# Patient Record
Sex: Female | Born: 1995 | Race: Black or African American | Hispanic: No | Marital: Single | Smoking: Never smoker
Health system: Southern US, Community
[De-identification: ages and names within clinical notes are randomized; demographics above are authoritative.]

## PROBLEM LIST (undated history)

## (undated) ENCOUNTER — Inpatient Hospital Stay (HOSPITAL_COMMUNITY): Payer: Self-pay

## (undated) DIAGNOSIS — I10 Essential (primary) hypertension: Secondary | ICD-10-CM

## (undated) HISTORY — DX: Essential (primary) hypertension: I10

## (undated) HISTORY — PX: NO PAST SURGERIES: SHX2092

---
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
## 2015-01-03 ENCOUNTER — Ambulatory Visit (INDEPENDENT_AMBULATORY_CARE_PROVIDER_SITE_OTHER): Payer: Medicaid Other | Admitting: *Deleted

## 2015-01-03 VITALS — BP 138/80 | HR 111

## 2015-01-03 DIAGNOSIS — O133 Gestational [pregnancy-induced] hypertension without significant proteinuria, third trimester: Secondary | ICD-10-CM

## 2015-01-06 NOTE — Progress Notes (Signed)
NST reviewed and reactive.  

## 2015-01-07 ENCOUNTER — Ambulatory Visit (INDEPENDENT_AMBULATORY_CARE_PROVIDER_SITE_OTHER): Payer: Medicaid Other | Admitting: Obstetrics and Gynecology

## 2015-01-07 VITALS — BP 145/81 | HR 102 | Wt 250.1 lb

## 2015-01-07 DIAGNOSIS — O99213 Obesity complicating pregnancy, third trimester: Secondary | ICD-10-CM | POA: Insufficient documentation

## 2015-01-07 DIAGNOSIS — O133 Gestational [pregnancy-induced] hypertension without significant proteinuria, third trimester: Secondary | ICD-10-CM

## 2015-01-07 DIAGNOSIS — E669 Obesity, unspecified: Secondary | ICD-10-CM

## 2015-01-07 LAB — POCT URINALYSIS DIP (DEVICE)
BILIRUBIN URINE: NEGATIVE
Glucose, UA: NEGATIVE mg/dL
Hgb urine dipstick: NEGATIVE
Ketones, ur: NEGATIVE mg/dL
Leukocytes, UA: NEGATIVE
Nitrite: NEGATIVE
PH: 7 (ref 5.0–8.0)
PROTEIN: NEGATIVE mg/dL
SPECIFIC GRAVITY, URINE: 1.015 (ref 1.005–1.030)
Urobilinogen, UA: 0.2 mg/dL (ref 0.0–1.0)

## 2015-01-07 NOTE — Progress Notes (Signed)
Subjective:  Erin Cardenas is a 19 y.o. G1P0000 at [redacted]w[redacted]d being seen today for ongoing prenatal care.  Patient reports no complaints. No H/A, visual sx.  Contractions: Irregular.  Vag. Bleeding: None. Movement: Present. Denies leaking of fluid.   The following portions of the patient's history were reviewed and updated as appropriate: allergies, current medications, past family history, past medical history, past social history, past surgical history and problem list.   Objective:   Filed Vitals:   01/07/15 1141 01/07/15 1207  BP: 148/72 145/81  Pulse: 102   Weight: 250 lb 1.6 oz (113.445 kg)     Fetal Status: Fetal Heart Rate (bpm): NST   Movement: Present     General:  Alert, oriented and cooperative. Patient is in no acute distress.  Skin: Skin is warm and dry. No rash noted.   Cardiovascular: Normal heart rate noted  Respiratory: Normal respiratory effort, no problems with respiration noted  Abdomen: Soft, gravid, appropriate for gestational age. Pain/Pressure: Present     Pelvic: Vag. Bleeding: None     Cervical exam deferred        Extremities: Normal range of motion.  Edema: Trace  Mental Status: Normal mood and affect. Normal behavior. Normal judgment and thought content.   Urinalysis: Urine Protein: Negative Urine Glucose: Negative  Assessment and Plan:  Pregnancy: G1P0000 at [redacted]w[redacted]d Gestational hypertension w/o significant proteinuria in 3rd trimester  Obesity affecting pregnancy in third trimester  1. Gestational hypertension w/o significant proteinuria in 3rd trimester Stable mild BP elevation.  Fetal testing, serial growth scans  Preterm labor symptoms and general obstetric precautions including but not limited to vaginal bleeding, contractions, leaking of fluid and fetal movement were reviewed in detail with the patient. Please refer to After Visit Summary for other counseling recommendations.  Return in about 1 week (around 01/14/2015) for 2x/wk as  scheduled.   Danae Orleans, CNM

## 2015-01-07 NOTE — Patient Instructions (Signed)

## 2015-01-07 NOTE — Progress Notes (Signed)
Pt denies H/A or visual disturbances.  Breastfeeding tip of the week reviewed.  

## 2015-01-10 ENCOUNTER — Ambulatory Visit (INDEPENDENT_AMBULATORY_CARE_PROVIDER_SITE_OTHER): Payer: Medicaid Other | Admitting: *Deleted

## 2015-01-10 VITALS — BP 140/61 | HR 80

## 2015-01-10 DIAGNOSIS — O133 Gestational [pregnancy-induced] hypertension without significant proteinuria, third trimester: Secondary | ICD-10-CM

## 2015-01-10 NOTE — Progress Notes (Signed)
NST reviewed and reactive.  

## 2015-01-14 ENCOUNTER — Ambulatory Visit (INDEPENDENT_AMBULATORY_CARE_PROVIDER_SITE_OTHER): Payer: Medicaid Other | Admitting: Advanced Practice Midwife

## 2015-01-14 VITALS — BP 140/86 | HR 115 | Wt 251.8 lb

## 2015-01-14 DIAGNOSIS — Z3493 Encounter for supervision of normal pregnancy, unspecified, third trimester: Secondary | ICD-10-CM | POA: Diagnosis not present

## 2015-01-14 DIAGNOSIS — O133 Gestational [pregnancy-induced] hypertension without significant proteinuria, third trimester: Secondary | ICD-10-CM

## 2015-01-14 DIAGNOSIS — Z23 Encounter for immunization: Secondary | ICD-10-CM | POA: Diagnosis not present

## 2015-01-14 LAB — POCT URINALYSIS DIP (DEVICE)
Bilirubin Urine: NEGATIVE
GLUCOSE, UA: NEGATIVE mg/dL
Ketones, ur: NEGATIVE mg/dL
Nitrite: NEGATIVE
PH: 6 (ref 5.0–8.0)
PROTEIN: NEGATIVE mg/dL
Specific Gravity, Urine: 1.02 (ref 1.005–1.030)
UROBILINOGEN UA: 0.2 mg/dL (ref 0.0–1.0)

## 2015-01-14 NOTE — Progress Notes (Signed)
Subjective:  Erin Cardenas is a 19 y.o. G1P0000 at [redacted]w[redacted]d being seen today for ongoing prenatal care.  She is here for NST for [facility] today.  Patient reports no complaints.  Contractions: Not present.  Vag. Bleeding: None. Movement: Present. Denies leaking of fluid.   The following portions of the patient's history were reviewed and updated as appropriate: allergies, current medications, past family history, past medical history, past social history, past surgical history and problem list.   Objective:   Filed Vitals:   01/14/15 1040  BP: 140/86  Pulse: 115  Weight: 251 lb 12.8 oz (114.216 kg)    Fetal Status:     Movement: Present     General:  Alert, oriented and cooperative. Patient is in no acute distress.  Skin: Skin is warm and dry. No rash noted.   Cardiovascular: Normal heart rate noted  Respiratory: Normal respiratory effort, no problems with respiration noted  Abdomen: Soft, gravid, appropriate for gestational age. Pain/Pressure: Present     Pelvic: Vag. Bleeding: None     Cervical exam deferred        Extremities: Normal range of motion.  Edema: Trace  Mental Status: Normal mood and affect. Normal behavior. Normal judgment and thought content.   Urinalysis: Urine Protein: Negative Urine Glucose: Negative  Assessment and Plan:  Pregnancy: G1P0000 at [redacted]w[redacted]d  1. Gestational hypertension w/o significant proteinuria in 3rd trimester  - Fetal nonstress test - Flu Vaccine QUAD 36+ mos IM; Standing - Flu Vaccine QUAD 36+ mos IM  Preterm labor symptoms and general obstetric precautions including but not limited to vaginal bleeding, contractions, leaking of fluid and fetal movement were reviewed in detail with the patient. Please refer to After Visit Summary for other counseling recommendations.  F/U as scheduled for 2x/week testing.     Hurshel Party, CNM

## 2015-01-16 LAB — OB RESULTS CONSOLE GBS: STREP GROUP B AG: POSITIVE

## 2015-01-17 ENCOUNTER — Ambulatory Visit (HOSPITAL_COMMUNITY)
Admission: RE | Admit: 2015-01-17 | Discharge: 2015-01-17 | Disposition: A | Discharge status: Home / Self Care | Payer: Medicaid Other | Source: Ambulatory Visit | Attending: Advanced Practice Midwife | Admitting: Advanced Practice Midwife

## 2015-01-17 ENCOUNTER — Other Ambulatory Visit: Payer: Self-pay | Admitting: Certified Nurse Midwife

## 2015-01-17 ENCOUNTER — Ambulatory Visit (INDEPENDENT_AMBULATORY_CARE_PROVIDER_SITE_OTHER): Payer: Medicaid Other | Admitting: *Deleted

## 2015-01-17 VITALS — BP 135/86 | HR 104 | Wt 252.7 lb

## 2015-01-17 DIAGNOSIS — O139 Gestational [pregnancy-induced] hypertension without significant proteinuria, unspecified trimester: Secondary | ICD-10-CM | POA: Diagnosis not present

## 2015-01-17 DIAGNOSIS — O133 Gestational [pregnancy-induced] hypertension without significant proteinuria, third trimester: Secondary | ICD-10-CM

## 2015-01-17 DIAGNOSIS — Z3A00 Weeks of gestation of pregnancy not specified: Secondary | ICD-10-CM | POA: Insufficient documentation

## 2015-01-17 DIAGNOSIS — IMO0002 Reserved for concepts with insufficient information to code with codable children: Secondary | ICD-10-CM

## 2015-01-17 NOTE — Progress Notes (Signed)
NST today

## 2015-01-19 NOTE — Progress Notes (Signed)
01/17/2015 NST reviewed and reactive 

## 2015-01-21 ENCOUNTER — Other Ambulatory Visit (HOSPITAL_COMMUNITY)
Admission: RE | Admit: 2015-01-21 | Discharge: 2015-01-21 | Disposition: A | Discharge status: Home / Self Care | Payer: Medicaid Other | Source: Ambulatory Visit | Attending: Advanced Practice Midwife | Admitting: Advanced Practice Midwife

## 2015-01-21 ENCOUNTER — Ambulatory Visit (INDEPENDENT_AMBULATORY_CARE_PROVIDER_SITE_OTHER): Payer: Medicaid Other | Admitting: Advanced Practice Midwife

## 2015-01-21 VITALS — BP 154/71 | HR 108 | Wt 253.5 lb

## 2015-01-21 DIAGNOSIS — Z113 Encounter for screening for infections with a predominantly sexual mode of transmission: Secondary | ICD-10-CM | POA: Insufficient documentation

## 2015-01-21 DIAGNOSIS — O133 Gestational [pregnancy-induced] hypertension without significant proteinuria, third trimester: Secondary | ICD-10-CM

## 2015-01-21 LAB — POCT URINALYSIS DIP (DEVICE)
BILIRUBIN URINE: NEGATIVE
Glucose, UA: 500 mg/dL — AB
HGB URINE DIPSTICK: NEGATIVE
Ketones, ur: NEGATIVE mg/dL
LEUKOCYTES UA: NEGATIVE
NITRITE: NEGATIVE
Protein, ur: NEGATIVE mg/dL
SPECIFIC GRAVITY, URINE: 1.025 (ref 1.005–1.030)
Urobilinogen, UA: 0.2 mg/dL (ref 0.0–1.0)
pH: 6 (ref 5.0–8.0)

## 2015-01-21 LAB — CBC
HCT: 33.6 % — ABNORMAL LOW (ref 36.0–46.0)
HEMOGLOBIN: 11.1 g/dL — AB (ref 12.0–15.0)
MCH: 28.9 pg (ref 26.0–34.0)
MCHC: 33 g/dL (ref 30.0–36.0)
MCV: 87.5 fL (ref 78.0–100.0)
MPV: 10.3 fL (ref 8.6–12.4)
Platelets: 320 10*3/uL (ref 150–400)
RBC: 3.84 MIL/uL — AB (ref 3.87–5.11)
RDW: 14.8 % (ref 11.5–15.5)
WBC: 10 10*3/uL (ref 4.0–10.5)

## 2015-01-21 LAB — COMPREHENSIVE METABOLIC PANEL
ALBUMIN: 3.4 g/dL — AB (ref 3.6–5.1)
ALK PHOS: 166 U/L (ref 47–176)
ALT: 12 U/L (ref 5–32)
AST: 14 U/L (ref 12–32)
BILIRUBIN TOTAL: 0.4 mg/dL (ref 0.2–1.1)
BUN: 7 mg/dL (ref 7–20)
CHLORIDE: 103 mmol/L (ref 98–110)
CO2: 23 mmol/L (ref 20–31)
CREATININE: 0.57 mg/dL (ref 0.50–1.00)
Calcium: 9.6 mg/dL (ref 8.9–10.4)
Glucose, Bld: 77 mg/dL (ref 65–99)
Potassium: 4.6 mmol/L (ref 3.8–5.1)
SODIUM: 136 mmol/L (ref 135–146)
TOTAL PROTEIN: 6.7 g/dL (ref 6.3–8.2)

## 2015-01-21 LAB — OB RESULTS CONSOLE GC/CHLAMYDIA: Gonorrhea: NEGATIVE

## 2015-01-21 NOTE — Progress Notes (Signed)
Pt denies H/A or visual disturbances.  [place] for growth scheduled 9/2.

## 2015-01-21 NOTE — Progress Notes (Signed)
Subjective:  Erin Cardenas is a 19 y.o. G1P0000 at [redacted]w[redacted]d being seen today for ongoing prenatal care.  Patient reports no complaints.  Contractions: Not present.  Vag. Bleeding: None. Movement: Present. Denies leaking of fluid.   The following portions of the patient's history were reviewed and updated as appropriate: allergies, current medications, past family history, past medical history, past social history, past surgical history and problem list.   Objective:   Filed Vitals:   01/21/15 1118 01/21/15 1149  BP: 150/79 154/71  Pulse: 108   Weight: 253 lb 8 oz (114.987 kg)     Fetal Status: Fetal Heart Rate (bpm): NST   Movement: Present     General:  Alert, oriented and cooperative. Patient is in no acute distress.  Skin: Skin is warm and dry. No rash noted.   Cardiovascular: Normal heart rate noted  Respiratory: Normal respiratory effort, no problems with respiration noted  Abdomen: Soft, gravid, appropriate for gestational age. Pain/Pressure: Present     Pelvic: Vag. Bleeding: None     Cervical exam deferred        Extremities: Normal range of motion.  Edema: Trace  Mental Status: Normal mood and affect. Normal behavior. Normal judgment and thought content.   Urinalysis: Urine Protein: Negative Urine Glucose: 3+  Assessment and Plan:  Pregnancy: G1P0000 at [redacted]w[redacted]d  1. Gestational hypertension w/o significant proteinuria in 3rd trimester  - Fetal nonstress test - Culture, beta strep (group b only) - GC/Chlamydia probe amp ([place])not at [facility] labs on 8/30  r/t elevated BP: - CBC - Comp Met (CMET) - Protein / creatinine ratio, urine  Preterm labor symptoms and general obstetric precautions including but not limited to vaginal bleeding, contractions, leaking of fluid and fetal movement were reviewed in detail with the patient.    Consult Dr Harolyn Rutherford r/t elevated BP today.  Reviewed assessment and BPs.  Labs drawn in clinic today.  Preeclampsia precautions given.   Pt has [place] at MFM on Friday and will have BP check at that time.  Please refer to After Visit Summary for other counseling recommendations.  Return in about 3 days (around 01/24/2015) for 2x/wk as scheduled.   Elvera Maria, CNM

## 2015-01-22 LAB — GC/CHLAMYDIA PROBE AMP (~~LOC~~) NOT AT ARMC
Chlamydia: NEGATIVE
Neisseria Gonorrhea: NEGATIVE

## 2015-01-22 LAB — PROTEIN / CREATININE RATIO, URINE
Creatinine, Urine: 131.8 mg/dL
PROTEIN CREATININE RATIO: 0.16 — AB (ref <0.15)
TOTAL PROTEIN, URINE: 21 mg/dL (ref 5–24)

## 2015-01-22 LAB — CULTURE, BETA STREP (GROUP B ONLY)

## 2015-01-24 ENCOUNTER — Other Ambulatory Visit: Payer: Self-pay

## 2015-01-24 ENCOUNTER — Ambulatory Visit (HOSPITAL_COMMUNITY): Payer: Medicaid Other

## 2015-01-24 ENCOUNTER — Encounter: Payer: Self-pay | Admitting: Advanced Practice Midwife

## 2015-01-24 DIAGNOSIS — O99820 Streptococcus B carrier state complicating pregnancy: Secondary | ICD-10-CM | POA: Insufficient documentation

## 2015-01-29 ENCOUNTER — Encounter (HOSPITAL_COMMUNITY): Payer: Self-pay | Admitting: *Deleted

## 2015-01-29 ENCOUNTER — Telehealth (HOSPITAL_COMMUNITY): Payer: Self-pay | Admitting: *Deleted

## 2015-01-29 ENCOUNTER — Ambulatory Visit (HOSPITAL_COMMUNITY): Payer: Medicaid Other

## 2015-01-29 ENCOUNTER — Ambulatory Visit (INDEPENDENT_AMBULATORY_CARE_PROVIDER_SITE_OTHER): Payer: Medicaid Other | Admitting: Obstetrics and Gynecology

## 2015-01-29 VITALS — BP 149/78 | HR 102 | Wt 255.2 lb

## 2015-01-29 DIAGNOSIS — O133 Gestational [pregnancy-induced] hypertension without significant proteinuria, third trimester: Secondary | ICD-10-CM | POA: Diagnosis not present

## 2015-01-29 LAB — POCT URINALYSIS DIP (DEVICE)
BILIRUBIN URINE: NEGATIVE
GLUCOSE, UA: NEGATIVE mg/dL
Hgb urine dipstick: NEGATIVE
KETONES UR: NEGATIVE mg/dL
LEUKOCYTES UA: NEGATIVE
Nitrite: NEGATIVE
PROTEIN: NEGATIVE mg/dL
SPECIFIC GRAVITY, URINE: 1.02 (ref 1.005–1.030)
Urobilinogen, UA: 0.2 mg/dL (ref 0.0–1.0)
pH: 6.5 (ref 5.0–8.0)

## 2015-01-29 NOTE — Progress Notes (Signed)
Subjective:  Erin Cardenas is a 19 y.o. G1P0000 at [redacted]w[redacted]d being seen today for ongoing prenatal care.  Patient reports no complaints.  Contractions: Not present.  Vag. Bleeding: None. Movement: Present. Denies leaking of fluid.   The following portions of the patient's history were reviewed and updated as appropriate: allergies, current medications, past family history, past medical history, past social history, past surgical history and problem list.   Objective:   Filed Vitals:   01/29/15 0929 01/29/15 0955  BP: 143/74 149/78  Pulse: 102   Weight: 255 lb 3.2 oz (115.758 kg)     Fetal Status: Fetal Heart Rate (bpm): NST Fundal Height: 38 cm Movement: Present  Presentation: Vertex  General:  Alert, oriented and cooperative. Patient is in no acute distress.  Skin: Skin is warm and dry. No rash noted.   Cardiovascular: Normal heart rate noted  Respiratory: Normal respiratory effort, no problems with respiration noted  Abdomen: Soft, gravid, appropriate for gestational age. Pain/Pressure: Present     Pelvic: Vag. Bleeding: None     Cervical exam deferred        Extremities: Normal range of motion.  Edema: Trace  Mental Status: Normal mood and affect. Normal behavior. Normal judgment and thought content.   Urinalysis: Urine Protein: Negative Urine Glucose: Negative  Assessment and Plan:  Pregnancy: G1P0000 at [redacted]w[redacted]d  1. Gestational hypertension w/o significant proteinuria in 3rd trimester - no severe elevations today, prec labs last visit unremarkable, no symptoms preeclampsia today - scheduling induction at 37 weeks - Fetal nonstress test reactive - mfm u/s today, will f/u results  Preterm labor symptoms and general obstetric precautions including but not limited to vaginal bleeding, contractions, leaking of fluid and fetal movement were reviewed in detail with the patient. Please refer to After Visit Summary for other counseling recommendations.   Return in about 2 days (around  01/31/2015) for NST as scheduled.   Kathrynn Running, MD

## 2015-01-29 NOTE — Telephone Encounter (Signed)
Preadmission screen  

## 2015-01-29 NOTE — Progress Notes (Deleted)
Pt denies H/A or visual disturbances.  

## 2015-01-29 NOTE — Progress Notes (Signed)
Pt denies H/A or visual disturbances. She missed [place] for growth on 9/2 due to no transportation- rescheduled to today @ 1500 provided pt will have transportation.

## 2015-01-31 ENCOUNTER — Ambulatory Visit (INDEPENDENT_AMBULATORY_CARE_PROVIDER_SITE_OTHER): Payer: Medicaid Other | Admitting: *Deleted

## 2015-01-31 VITALS — BP 153/79 | HR 102

## 2015-01-31 DIAGNOSIS — O133 Gestational [pregnancy-induced] hypertension without significant proteinuria, third trimester: Secondary | ICD-10-CM

## 2015-01-31 NOTE — Progress Notes (Signed)
NST performed today was reviewed and was found to be reactive.  Continue recommended antenatal testing and prenatal care.  

## 2015-01-31 NOTE — Progress Notes (Signed)
Pt denies H/A or visual disturbances.  She was not able to keep appt for [place] on 9/7.  Consult w/ Dr. Macon Large - pt does not need [place] today.  Return as scheduled for IOL.

## 2015-02-02 ENCOUNTER — Encounter (HOSPITAL_COMMUNITY): Payer: Self-pay

## 2015-02-02 ENCOUNTER — Inpatient Hospital Stay (HOSPITAL_COMMUNITY)
Admission: RE | Admit: 2015-02-02 | Discharge: 2015-02-05 | DRG: 775 | Disposition: A | Discharge status: Home / Self Care | Payer: Medicaid Other | Source: Ambulatory Visit | Attending: Family Medicine | Admitting: Family Medicine

## 2015-02-02 VITALS — BP 138/81 | HR 108 | Temp 98.3°F | Resp 20 | Ht 64.0 in | Wt 255.0 lb

## 2015-02-02 DIAGNOSIS — Z6841 Body Mass Index (BMI) 40.0 and over, adult: Secondary | ICD-10-CM | POA: Diagnosis not present

## 2015-02-02 DIAGNOSIS — O99214 Obesity complicating childbirth: Secondary | ICD-10-CM | POA: Diagnosis present

## 2015-02-02 DIAGNOSIS — O99213 Obesity complicating pregnancy, third trimester: Secondary | ICD-10-CM | POA: Diagnosis present

## 2015-02-02 DIAGNOSIS — O133 Gestational [pregnancy-induced] hypertension without significant proteinuria, third trimester: Secondary | ICD-10-CM | POA: Diagnosis present

## 2015-02-02 DIAGNOSIS — Z3A37 37 weeks gestation of pregnancy: Secondary | ICD-10-CM | POA: Diagnosis present

## 2015-02-02 DIAGNOSIS — O99820 Streptococcus B carrier state complicating pregnancy: Secondary | ICD-10-CM

## 2015-02-02 DIAGNOSIS — Z809 Family history of malignant neoplasm, unspecified: Secondary | ICD-10-CM | POA: Diagnosis not present

## 2015-02-02 DIAGNOSIS — Z30018 Encounter for initial prescription of other contraceptives: Secondary | ICD-10-CM | POA: Diagnosis not present

## 2015-02-02 DIAGNOSIS — Z8249 Family history of ischemic heart disease and other diseases of the circulatory system: Secondary | ICD-10-CM | POA: Diagnosis not present

## 2015-02-02 DIAGNOSIS — O99824 Streptococcus B carrier state complicating childbirth: Secondary | ICD-10-CM | POA: Diagnosis present

## 2015-02-02 DIAGNOSIS — IMO0002 Reserved for concepts with insufficient information to code with codable children: Secondary | ICD-10-CM | POA: Diagnosis present

## 2015-02-02 DIAGNOSIS — Z3490 Encounter for supervision of normal pregnancy, unspecified, unspecified trimester: Secondary | ICD-10-CM

## 2015-02-02 DIAGNOSIS — Z3493 Encounter for supervision of normal pregnancy, unspecified, third trimester: Secondary | ICD-10-CM

## 2015-02-02 LAB — RAPID HIV SCREEN (HIV 1/2 AB+AG)
HIV 1/2 Antibodies: NONREACTIVE
HIV-1 P24 ANTIGEN - HIV24: NONREACTIVE

## 2015-02-02 LAB — PROTEIN / CREATININE RATIO, URINE
Creatinine, Urine: 181 mg/dL
PROTEIN CREATININE RATIO: 0.07 mg/mg{creat} (ref 0.00–0.15)
TOTAL PROTEIN, URINE: 12 mg/dL

## 2015-02-02 LAB — COMPREHENSIVE METABOLIC PANEL
ALK PHOS: 172 U/L — AB (ref 38–126)
ALT: 13 U/L — AB (ref 14–54)
ANION GAP: 8 (ref 5–15)
AST: 14 U/L — ABNORMAL LOW (ref 15–41)
Albumin: 3.2 g/dL — ABNORMAL LOW (ref 3.5–5.0)
BUN: 10 mg/dL (ref 6–20)
CALCIUM: 9 mg/dL (ref 8.9–10.3)
CO2: 22 mmol/L (ref 22–32)
CREATININE: 0.58 mg/dL (ref 0.44–1.00)
Chloride: 104 mmol/L (ref 101–111)
Glucose, Bld: 87 mg/dL (ref 65–99)
Potassium: 3.8 mmol/L (ref 3.5–5.1)
SODIUM: 134 mmol/L — AB (ref 135–145)
Total Bilirubin: 0.3 mg/dL (ref 0.3–1.2)
Total Protein: 6.9 g/dL (ref 6.5–8.1)

## 2015-02-02 LAB — ABO/RH: ABO/RH(D): O POS

## 2015-02-02 LAB — CBC
HEMATOCRIT: 31.6 % — AB (ref 36.0–46.0)
Hemoglobin: 10.4 g/dL — ABNORMAL LOW (ref 12.0–15.0)
MCH: 28.9 pg (ref 26.0–34.0)
MCHC: 32.9 g/dL (ref 30.0–36.0)
MCV: 87.8 fL (ref 78.0–100.0)
PLATELETS: 273 10*3/uL (ref 150–400)
RBC: 3.6 MIL/uL — ABNORMAL LOW (ref 3.87–5.11)
RDW: 15.1 % (ref 11.5–15.5)
WBC: 10.4 10*3/uL (ref 4.0–10.5)

## 2015-02-02 LAB — TYPE AND SCREEN
ABO/RH(D): O POS
Antibody Screen: NEGATIVE

## 2015-02-02 LAB — RPR: RPR: NONREACTIVE

## 2015-02-02 MED ORDER — OXYCODONE-ACETAMINOPHEN 5-325 MG PO TABS: 1.0000 | ORAL_TABLET | ORAL | Status: DC | PRN

## 2015-02-02 MED ORDER — OXYTOCIN 40 UNITS IN LACTATED RINGERS INFUSION - SIMPLE MED: 1.0000 m[IU]/min | INTRAVENOUS | Status: DC

## 2015-02-02 MED ORDER — LACTATED RINGERS IV SOLN
INTRAVENOUS | Status: DC
  Administered 2015-02-02: 02:00:00 via INTRAVENOUS
  Administered 2015-02-03: 1000 mL via INTRAVENOUS
  Administered 2015-02-03: 09:00:00 via INTRAVENOUS

## 2015-02-02 MED ORDER — LACTATED RINGERS IV SOLN
500.0000 mL | INTRAVENOUS | Status: DC | PRN
  Administered 2015-02-03: 1000 mL via INTRAVENOUS

## 2015-02-02 MED ORDER — OXYTOCIN BOLUS FROM INFUSION
500.0000 mL | INTRAVENOUS | Status: DC
  Administered 2015-02-03: 500 mL via INTRAVENOUS

## 2015-02-02 MED ORDER — PENICILLIN G POTASSIUM 5000000 UNITS IJ SOLR
2.5000 10*6.[IU] | INTRAMUSCULAR | Status: DC
  Administered 2015-02-03 (×3): 2.5 10*6.[IU] via INTRAVENOUS
  Filled 2015-02-02 (×12): qty 2.5

## 2015-02-02 MED ORDER — OXYTOCIN 40 UNITS IN LACTATED RINGERS INFUSION - SIMPLE MED
62.5000 mL/h | INTRAVENOUS | Status: DC
  Administered 2015-02-03: 62.5 mL/h via INTRAVENOUS
  Filled 2015-02-02: qty 1000

## 2015-02-02 MED ORDER — OXYCODONE-ACETAMINOPHEN 5-325 MG PO TABS: 2.0000 | ORAL_TABLET | ORAL | Status: DC | PRN

## 2015-02-02 MED ORDER — MISOPROSTOL 50MCG HALF TABLET
50.0000 ug | ORAL_TABLET | ORAL | Status: DC | PRN
  Administered 2015-02-02 – 2015-02-03 (×6): 50 ug via ORAL
  Filled 2015-02-02 (×7): qty 0.5

## 2015-02-02 MED ORDER — NALBUPHINE HCL 10 MG/ML IJ SOLN
10.0000 mg | INTRAMUSCULAR | Status: DC | PRN
  Administered 2015-02-03 (×2): 10 mg via INTRAVENOUS
  Filled 2015-02-02 (×3): qty 1

## 2015-02-02 MED ORDER — CITRIC ACID-SODIUM CITRATE 334-500 MG/5ML PO SOLN: 30.0000 mL | ORAL | Status: DC | PRN

## 2015-02-02 MED ORDER — PENICILLIN G POTASSIUM 5000000 UNITS IJ SOLR
5.0000 10*6.[IU] | Freq: Once | INTRAVENOUS | Status: DC
  Filled 2015-02-02: qty 5

## 2015-02-02 MED ORDER — TERBUTALINE SULFATE 1 MG/ML IJ SOLN: 0.2500 mg | Freq: Once | INTRAMUSCULAR | Status: DC | PRN

## 2015-02-02 MED ORDER — ONDANSETRON HCL 4 MG/2ML IJ SOLN: 4.0000 mg | Freq: Four times a day (QID) | INTRAMUSCULAR | Status: DC | PRN

## 2015-02-02 MED ORDER — ACETAMINOPHEN 325 MG PO TABS: 650.0000 mg | ORAL_TABLET | ORAL | Status: DC | PRN

## 2015-02-02 MED ORDER — LIDOCAINE HCL (PF) 1 % IJ SOLN
30.0000 mL | INTRAMUSCULAR | Status: DC | PRN
  Filled 2015-02-02: qty 30

## 2015-02-02 MED ORDER — PROMETHAZINE HCL 25 MG/ML IJ SOLN
12.5000 mg | INTRAMUSCULAR | Status: DC | PRN
  Administered 2015-02-03 (×2): 12.5 mg via INTRAVENOUS
  Filled 2015-02-02 (×2): qty 1

## 2015-02-02 NOTE — Progress Notes (Signed)
Patient ID: Erin Cardenas, female   DOB: [birth year], 19 y.o.   MRN: [id number] S: c/o mild contractions O: VSS, FHR pattern reassurring, mild uc's, SVE cl/th/post/high A: GHTN at 37 wks IOL P: continue cytotec

## 2015-02-02 NOTE — Progress Notes (Signed)
Erin Cardenas is a 19 y.o. G1P0000 at [redacted]w[redacted]d by ultrasound admitted for induction of labor due to Hypertension.  Subjective:   Objective: BP 137/77 mmHg  Pulse 113  Temp(Src) 98.4 F (36.9 C) (Oral)  Resp 18  Ht  (1.626 m)  Wt 255 lb (115.667 kg)  BMI 43.75 kg/m2  LMP 05/19/2014      FHT:  FHR: 140 bpm, variability: moderate,  accelerations:  Present,  decelerations:  Absent UC:   occasional SVE:   Dilation: Closed (internal os closed--external 0.5) Effacement (%): 30 Station: Ballotable Exam by:: J.Thornton, RN   Labs: Lab Results  Component Value Date   WBC 10.4 02/02/2015   HGB 10.4* 02/02/2015   HCT 31.6* 02/02/2015   MCV 87.8 02/02/2015   PLT 273 02/02/2015    Assessment / Plan: Induction of labor due to gestational hypertension,  progressing well on pitocin  Labor: not in labor cervical ripening in process Preeclampsia:  no signs or symptoms of toxicity, intake and ouput balanced and labs stable Fetal Wellbeing:  Category I Pain Control:  Labor support without medications I/D:  n/a Anticipated MOD:  NSVD  Erin Cardenas 02/02/2015, 1:26 PM

## 2015-02-02 NOTE — H&P (Signed)
OBSTETRIC ADMISSION HISTORY AND PHYSICAL  Erin Cardenas is a 19 y.o. female G1P0000 with IUP at [redacted]w[redacted]d by L/10 presenting for IOL due to gestational hypertension. She reports +FMs, No LOF, no VB, no blurry vision, headaches or peripheral edema, and RUQ pain.  She plans on breast feeding. She request Nexplanon for birth control.  Dating: By L/10 --->  Estimated Date of Delivery: 02/23/15  Prenatal History/Complications:  Clinic  [facility] Prenatal Labs  Dating  LMP c/w 10w sono Blood type: O/POS/-- (03/18 1113) O+  Genetic Screen 1 Screen:  NT normal   AFP: normal Antibody:NEG (03/18 1113)neg  Anatomic [place]  [redacted]w[redacted]d normal sono Rubella: 2.39 (03/18 1113)Immune  GTT Early:               Third trimester: 105 RPR: NON REAC (03/18 1113) NR  Flu vaccine  01/14/15 HBsAg: NEGATIVE (03/18 1113) neg  TDaP vaccine  12/09/14                                           HIV: NONREACTIVE (03/18 1113) NR  GBS   POS                                      GBS:   Contraception  considering IP nexpl Pap:  Baby Food  breast   Circumcision    Pediatrician    Support Person      Past Medical History: Past Medical History  Diagnosis Date  . Hypertension    Past Surgical History: Past Surgical History  Procedure Laterality Date  . No past surgeries      Obstetrical History: OB History    Gravida Para Term Preterm AB TAB SAB Ectopic Multiple Living   1 0 0 0 0 0 0 0 0 0       Social History: Social History   Social History  . Marital Status: Single    Spouse Name: N/A  . Number of Children: N/A  . Years of Education: N/A   Social History Main Topics  . Smoking status: Never Smoker   . Smokeless tobacco: Never Used  . Alcohol Use: No  . Drug Use: No  . Sexual Activity: Yes   Other Topics Concern  . None   Social History Narrative    Family History: Family History  Problem Relation Age of Onset  . Hypertension Mother   . Cancer Maternal Grandmother     Allergies: No Known  Allergies  Prescriptions prior to admission  Medication Sig Dispense Refill Last Dose  . Prenatal Vit-Fe Fumarate-FA (PRENATAL 19) 29-1 MG CHEW Chew 1 tablet by mouth daily. 30 tablet 12 Taking     Review of Systems  All systems reviewed and negative except as stated in HPI  Blood pressure 130/75, pulse 108, temperature 98.8 F (37.1 C), temperature source Oral, height 5\' 4"  (1.626 m), weight 255 lb (115.667 kg), last menstrual period 05/19/2014. General appearance: alert, cooperative and appears stated age Lungs: clear to auscultation bilaterally Heart: regular rate and rhythm Abdomen: soft, non-tender; bowel sounds normal Pelvic: adequate Extremities: Homans sign is negative, no sign of DVT. 1+ pitting edema.  DTR's 2+ in patella. No clonus Presentation: cephalic Fetal monitoringBaseline: 135 bpm, Variability: Good {> 6 bpm) and Accelerations: Reactive Uterine activityNone     Prenatal  labs: ABO, Rh: O/POS/-- (03/18 1113) Antibody: NEG (03/18 1113) Rubella:   RPR: NON REAC (07/18 1710)  HBsAg: NEGATIVE (03/18 1113)  HIV: NONREACTIVE (07/18 1710)  GBS: Positive (08/25 0000)  1 hr Glucola 105 Genetic screening  NT wnl Anatomy US wnl  Prenatal Transfer Tool  Maternal Diabetes: No Genetic Screening: Normal Maternal Ultrasounds/Referrals: Normal Fetal Ultrasounds or other Referrals:  None Maternal Substance Abuse:  No Significant Maternal Medications:  None Significant Maternal Lab Results: Lab values include: Group B Strep positive  No results found for this or any previous visit (from the past 24 hour(s)).  Patient Active Problem List   Diagnosis Date Noted  . Term pregnancy 02/02/2015  . GBS (group B Streptococcus carrier), +RV culture, currently pregnant 01/24/2015  . Obesity affecting pregnancy in third trimester 01/07/2015  . Gestational hypertension w/o significant proteinuria in 3rd trimester 12/23/2014  . Supervision of normal pregnancy 08/09/2014  . Teen  pregnancy 08/09/2014    Assessment: Erin Cardenas is a 19 y.o. G1P0000 at [redacted]w[redacted]d here for IOL for gestational hypertension  #Labor:Induction with cervical ripening. Plan for cytotec and FB when appropriate. Pitocin to start when favorable (bishop>8) #Pain: IV meds and epidural in active labor #FWB: Cat I #ID:  GBS pos- PCN #MOF: breast/bottle #MOC:Nexplanon, likely inpatient #Circ:  Desired inpatient, baby will be on FOB insurance  #gHTN: BP is within normal limits.  -will get CMP and UPC for baseline -monitor BP -treat BP>160/110, if not responsive consider magnesium  Erin Cardenas 02/02/2015, 2:00 AM

## 2015-02-03 ENCOUNTER — Encounter (HOSPITAL_COMMUNITY): Payer: Self-pay

## 2015-02-03 ENCOUNTER — Inpatient Hospital Stay (HOSPITAL_COMMUNITY): Payer: Medicaid Other | Admitting: Anesthesiology

## 2015-02-03 DIAGNOSIS — O133 Gestational [pregnancy-induced] hypertension without significant proteinuria, third trimester: Secondary | ICD-10-CM

## 2015-02-03 DIAGNOSIS — O99824 Streptococcus B carrier state complicating childbirth: Secondary | ICD-10-CM

## 2015-02-03 DIAGNOSIS — Z3A37 37 weeks gestation of pregnancy: Secondary | ICD-10-CM

## 2015-02-03 LAB — CBC
HEMATOCRIT: 32.2 % — AB (ref 36.0–46.0)
HEMOGLOBIN: 10.7 g/dL — AB (ref 12.0–15.0)
MCH: 28.8 pg (ref 26.0–34.0)
MCHC: 33.2 g/dL (ref 30.0–36.0)
MCV: 86.8 fL (ref 78.0–100.0)
Platelets: 274 10*3/uL (ref 150–400)
RBC: 3.71 MIL/uL — AB (ref 3.87–5.11)
RDW: 15.1 % (ref 11.5–15.5)
WBC: 14.4 10*3/uL — AB (ref 4.0–10.5)

## 2015-02-03 MED ORDER — ONDANSETRON HCL 4 MG/2ML IJ SOLN: 4.0000 mg | INTRAMUSCULAR | Status: DC | PRN

## 2015-02-03 MED ORDER — SIMETHICONE 80 MG PO CHEW: 80.0000 mg | CHEWABLE_TABLET | ORAL | Status: DC | PRN

## 2015-02-03 MED ORDER — OXYCODONE-ACETAMINOPHEN 5-325 MG PO TABS: 2.0000 | ORAL_TABLET | ORAL | Status: DC | PRN

## 2015-02-03 MED ORDER — ACETAMINOPHEN 325 MG PO TABS: 650.0000 mg | ORAL_TABLET | ORAL | Status: DC | PRN

## 2015-02-03 MED ORDER — EPHEDRINE 5 MG/ML INJ: 10.0000 mg | INTRAVENOUS | Status: DC | PRN

## 2015-02-03 MED ORDER — FENTANYL 2.5 MCG/ML BUPIVACAINE 1/10 % EPIDURAL INFUSION (WH - ANES)
14.0000 mL/h | INTRAMUSCULAR | Status: DC | PRN
  Administered 2015-02-03: 14 mL/h via EPIDURAL
  Filled 2015-02-03: qty 125

## 2015-02-03 MED ORDER — DIPHENHYDRAMINE HCL 50 MG/ML IJ SOLN: 12.5000 mg | INTRAMUSCULAR | Status: DC | PRN

## 2015-02-03 MED ORDER — ZOLPIDEM TARTRATE 5 MG PO TABS: 5.0000 mg | ORAL_TABLET | Freq: Every evening | ORAL | Status: DC | PRN

## 2015-02-03 MED ORDER — WITCH HAZEL-GLYCERIN EX PADS: 1.0000 "application " | MEDICATED_PAD | CUTANEOUS | Status: DC | PRN

## 2015-02-03 MED ORDER — LANOLIN HYDROUS EX OINT: TOPICAL_OINTMENT | CUTANEOUS | Status: DC | PRN

## 2015-02-03 MED ORDER — PRENATAL MULTIVITAMIN CH
1.0000 | ORAL_TABLET | Freq: Every day | ORAL | Status: DC
  Administered 2015-02-04 – 2015-02-05 (×2): 1 via ORAL
  Filled 2015-02-03 (×2): qty 1

## 2015-02-03 MED ORDER — TETANUS-DIPHTH-ACELL PERTUSSIS 5-2.5-18.5 LF-MCG/0.5 IM SUSP
0.5000 mL | Freq: Once | INTRAMUSCULAR | Status: DC
  Filled 2015-02-03: qty 0.5

## 2015-02-03 MED ORDER — PHENYLEPHRINE 40 MCG/ML (10ML) SYRINGE FOR IV PUSH (FOR BLOOD PRESSURE SUPPORT)
PREFILLED_SYRINGE | INTRAVENOUS | Status: AC
  Filled 2015-02-03: qty 20

## 2015-02-03 MED ORDER — IBUPROFEN 600 MG PO TABS
600.0000 mg | ORAL_TABLET | Freq: Four times a day (QID) | ORAL | Status: DC
  Administered 2015-02-03 – 2015-02-05 (×8): 600 mg via ORAL
  Filled 2015-02-03 (×8): qty 1

## 2015-02-03 MED ORDER — PHENYLEPHRINE 40 MCG/ML (10ML) SYRINGE FOR IV PUSH (FOR BLOOD PRESSURE SUPPORT): 80.0000 ug | PREFILLED_SYRINGE | INTRAVENOUS | Status: DC | PRN

## 2015-02-03 MED ORDER — FENTANYL CITRATE (PF) 100 MCG/2ML IJ SOLN
100.0000 ug | INTRAMUSCULAR | Status: DC | PRN
  Administered 2015-02-03: 100 ug via INTRAVENOUS
  Filled 2015-02-03: qty 2

## 2015-02-03 MED ORDER — BENZOCAINE-MENTHOL 20-0.5 % EX AERO
1.0000 "application " | INHALATION_SPRAY | CUTANEOUS | Status: DC | PRN
  Administered 2015-02-05: 1 via TOPICAL
  Filled 2015-02-03 (×2): qty 56

## 2015-02-03 MED ORDER — ONDANSETRON HCL 4 MG PO TABS: 4.0000 mg | ORAL_TABLET | ORAL | Status: DC | PRN

## 2015-02-03 MED ORDER — DIPHENHYDRAMINE HCL 25 MG PO CAPS: 25.0000 mg | ORAL_CAPSULE | Freq: Four times a day (QID) | ORAL | Status: DC | PRN

## 2015-02-03 MED ORDER — OXYCODONE-ACETAMINOPHEN 5-325 MG PO TABS: 1.0000 | ORAL_TABLET | ORAL | Status: DC | PRN

## 2015-02-03 MED ORDER — DIBUCAINE 1 % RE OINT
1.0000 "application " | TOPICAL_OINTMENT | RECTAL | Status: DC | PRN
  Filled 2015-02-03: qty 28

## 2015-02-03 MED ORDER — LIDOCAINE HCL (PF) 1 % IJ SOLN
INTRAMUSCULAR | Status: DC | PRN
  Administered 2015-02-03 (×2): 4 mL

## 2015-02-03 MED ORDER — SENNOSIDES-DOCUSATE SODIUM 8.6-50 MG PO TABS
2.0000 | ORAL_TABLET | ORAL | Status: DC
  Administered 2015-02-03 – 2015-02-04 (×2): 2 via ORAL
  Filled 2015-02-03 (×3): qty 2

## 2015-02-03 NOTE — Progress Notes (Signed)
Erin Cardenas is a 19 y.o. G1P0000 at 108w1d by ultrasound admitted for induction of labor due to Hypertension.  Subjective:   Objective: BP 96/67 mmHg  Pulse 93  Temp(Src) 97.8 F (36.6 C) (Oral)  Resp 18  Ht  (1.626 m)  Wt 255 lb (115.667 kg)  BMI 43.75 kg/m2  LMP 05/19/2014      FHT:  FHR: 135 bpm, variability: moderate,  accelerations:  Present,  decelerations:  Absent UC:   regular, every 2-3 minutes SVE:   Dilation: 1 Effacement (%): 50 Station: -2 Exam by:: Erin Europe RN  Labs: Lab Results  Component Value Date   WBC 10.4 02/02/2015   HGB 10.4* 02/02/2015   HCT 31.6* 02/02/2015   MCV 87.8 02/02/2015   PLT 273 02/02/2015    Assessment / Plan: Induction of labor due to gestational hypertension,  progressing well on pitocin  Labor: Progressing normally Preeclampsia:  no signs or symptoms of toxicity and intake and ouput balanced Fetal Wellbeing:  Category I Pain Control:  fentanyl I/D:  n/a Anticipated MOD:  NSVD  Erin Cardenas 02/03/2015, 6:26 AM

## 2015-02-03 NOTE — Progress Notes (Signed)
Erin Cardenas is a 19 y.o. G1P0000 at [redacted]w[redacted]d by ultrasound admitted for induction of labor due to Hypertension.  Subjective:   Objective: BP 96/67 mmHg  Pulse 93  Temp(Src) 97.8 F (36.6 C) (Oral)  Resp 18  Ht  (1.626 m)  Wt 255 lb (115.667 kg)  BMI 43.75 kg/m2  LMP 05/19/2014      FHT:  FHR: 140 bpm, variability: moderate,  accelerations:  Present,  decelerations:  Absent UC:   regular, every 3-6 minutes SVE:   Dilation: 1 Effacement (%): 50 Station: -2 Exam by:: Dan Europe RN  Labs: Lab Results  Component Value Date   WBC 10.4 02/02/2015   HGB 10.4* 02/02/2015   HCT 31.6* 02/02/2015   MCV 87.8 02/02/2015   PLT 273 02/02/2015    Assessment / Plan: Induction of labor due to gestational hypertension,  progressing well on pitocin  Labor: Progressing normally Preeclampsia:  no signs or symptoms of toxicity and intake and ouput balanced Fetal Wellbeing:  Category I Pain Control:  nubain I/D:  n/a Anticipated MOD:  NSVD  LAWSON, MARIE [name] 02/03/2015, 4:05 AM

## 2015-02-03 NOTE — Anesthesia Preprocedure Evaluation (Signed)
Anesthesia Evaluation  Patient identified by MRN, date of birth, ID band Patient awake    Reviewed: Allergy & Precautions, NPO status , Patient's Chart, lab work & pertinent test results  History of Anesthesia Complications Negative for: history of anesthetic complications  Airway Mallampati: III  TM Distance: >3 FB Neck ROM: Full    Dental no notable dental hx. (+) Dental Advisory Given   Pulmonary neg pulmonary ROS,    Pulmonary exam normal breath sounds clear to auscultation       Cardiovascular hypertension, Normal cardiovascular exam Rhythm:Regular Rate:Normal     Neuro/Psych negative neurological ROS  negative psych ROS   GI/Hepatic negative GI ROS, Neg liver ROS,   Endo/Other  Morbid obesity  Renal/GU negative Renal ROS  negative genitourinary   Musculoskeletal negative musculoskeletal ROS (+)   Abdominal   Peds negative pediatric ROS (+)  Hematology negative hematology ROS (+)   Anesthesia Other Findings   Reproductive/Obstetrics (+) Pregnancy                             Anesthesia Physical Anesthesia Plan  ASA: III  Anesthesia Plan: Epidural   Post-op Pain Management:    Induction:   Airway Management Planned:   Additional Equipment:   Intra-op Plan:   Post-operative Plan:   Informed Consent: I have reviewed the patients History and Physical, chart, labs and discussed the procedure including the risks, benefits and alternatives for the proposed anesthesia with the patient or authorized representative who has indicated his/her understanding and acceptance.     Plan Discussed with: Anesthesiologist  Anesthesia Plan Comments:         Anesthesia Quick Evaluation

## 2015-02-03 NOTE — Anesthesia Procedure Notes (Signed)
Epidural Patient location during procedure: OB  Staffing Anesthesiologist: Ellington Greenslade Performed by: anesthesiologist   Preanesthetic Checklist Completed: patient identified, site marked, surgical consent, pre-op evaluation, timeout performed, IV checked, risks and benefits discussed and monitors and equipment checked  Epidural Patient position: sitting Prep: site prepped and draped and DuraPrep Patient monitoring: continuous pulse ox and blood pressure Approach: midline Location: L3-L4 Injection technique: LOR saline  Needle:  Needle type: Tuohy  Needle gauge: 17 G Needle length: 9 cm and 9 Needle insertion depth: 10 cm Catheter type: closed end flexible Catheter size: 19 Gauge Catheter at skin depth: 15 cm Test dose: negative  Assessment Events: blood not aspirated, injection not painful, no injection resistance, negative IV test and paresthesia (L sided paresthesia, resolved immediately and prior to injection)  Additional Notes Patient identified. Risks/Benefits/Options discussed with patient including but not limited to bleeding, infection, nerve damage, paralysis, failed block, incomplete pain control, headache, blood pressure changes, nausea, vomiting, reactions to medication both or allergic, itching and postpartum back pain. Confirmed with bedside nurse the patient's most recent platelet count. Confirmed with patient that they are not currently taking any anticoagulation, have any bleeding history or any family history of bleeding disorders. Patient expressed understanding and wished to proceed. All questions were answered. Sterile technique was used throughout the entire procedure. Please see nursing notes for vital signs. Test dose was given through epidural catheter and negative prior to continuing to dose epidural or start infusion. Warning signs of high block given to the patient including shortness of breath, tingling/numbness in hands, complete motor block, or any  concerning symptoms with instructions to call for help. Patient was given instructions on fall risk and not to get out of bed. All questions and concerns addressed with instructions to call with any issues or inadequate analgesia.

## 2015-02-04 NOTE — Progress Notes (Signed)
Post Partum Day 1  Subjective:  Erin Cardenas is a 19 y.o. G1P1001 [redacted]w[redacted]d s/p SVD.  No acute events overnight.  Pt denies problems with ambulating, voiding or po intake.  She denies nausea or vomiting.  Pain is well controlled.  She has had flatus. She has not had bowel movement.  Lochia Large.  Plan for birth control is outpatient Nexplanon.  Method of Feeding: Breast.  Objective: BP 127/69 mmHg  Pulse 102  Temp(Src) 98.3 F (36.8 C) (Oral)  Resp 18  Ht  (1.626 m)  Wt 255 lb (115.667 kg)  BMI 43.75 kg/m2  SpO2 100%  LMP 05/19/2014  Breastfeeding? Unknown  Physical Exam:  General: alert, cooperative and no distress Lochia:normal flow Chest: non-labored breathing Heart: RRR  Abdomen: soft, nontender, fundus firm at/below umbilicus Uterine Fundus: firm DVT Evaluation: No evidence of DVT seen on physical exam. Extremities: no edema   Recent Labs  02/02/15 0225 02/03/15 0800  HGB 10.4* 10.7*  HCT 31.6* 32.2*    Assessment/Plan:  ASSESSMENT: Erin Cardenas is a 19 y.o. G1P1001 [redacted]w[redacted]d ppd #1 s/p NSVD doing well.   Continued PP care Lactation support D/C tomorrow   LOS: 2 days   Tarri Abernethy, MD PGY-1 [facility]  02/04/2015, 8:08 AM   OB fellow attestation Post Partum Day 1 I have seen and examined this patient and agree with above documentation in the resident's note.   Erin Cardenas is a 19 y.o. G1P1001 s/p NSVD.  Pt denies problems with ambulating, voiding or po intake. Pain is well controlled.  Plan for birth control is Nexplanon.  Method of Feeding: Breast  PE:  BP 127/69 mmHg  Pulse 102  Temp(Src) 98.3 F (36.8 C) (Oral)  Resp 18  Ht  (1.626 m)  Wt 255 lb (115.667 kg)  BMI 43.75 kg/m2  SpO2 100%  LMP 05/19/2014  Breastfeeding? Unknown Fundus firm  Plan for discharge: PPD#2  Federico Flake, MD 12:12 PM

## 2015-02-04 NOTE — Lactation Note (Signed)
This note was copied from the chart of Erin Cardenas. Lactation Consultation Note Mom requested LC d/t difficulty latching and baby not interested in BF. Baby sleepy. RN stimulated baby to wake up for BF. W/tcup hold assisted in latching. Repositioned mom and baby for closer position.  Mom having some heavy bleeding. Discussed how BF will help contract the uterus and help bleeding.  Explained newborn behavior, cluster feeding, being sleepy and needing stimulated for BF, I&O, cluster feeding, supply and demand.  Hand expression to demonstrate colostrum. Encouraged breast massage during BF. Patient Name: Erin Lorann Tani [id number] Date: 02/04/2015 Reason for consult: Follow-up assessment;Difficult latch   Maternal Data    Feeding Feeding Type: Breast Fed Length of feed: 10 min (still bf)  LATCH Score/Interventions Latch: Repeated attempts needed to sustain latch, nipple held in mouth throughout feeding, stimulation needed to elicit sucking reflex. Intervention(s): Skin to skin;Teach feeding cues;Waking techniques Intervention(s): Adjust position;Assist with latch;Breast massage;Breast compression  Audible Swallowing: A few with stimulation Intervention(s): Skin to skin;Hand expression Intervention(s): Skin to skin;Hand expression;Alternate breast massage  Type of Nipple: Everted at rest and after stimulation Intervention(s): Hand pump  Comfort (Breast/Nipple): Soft / non-tender     Hold (Positioning): Assistance needed to correctly position infant at breast and maintain latch. Intervention(s): Skin to skin;Position options;Support Pillows;Breastfeeding basics reviewed  LATCH Score: 7  Lactation Tools Discussed/Used Tools: Pump Breast pump type: Manual Pump Review: Setup, frequency, and cleaning Initiated by:: RN Date initiated:: 02/04/15   Consult Status Consult Status: Follow-up Date: 02/05/15 Follow-up type: In-patient    Charyl Dancer 02/04/2015, 5:31  AM

## 2015-02-04 NOTE — Lactation Note (Signed)
This note was copied from the chart of Boy Lateria Myre. Lactation Consultation Note Follow up visit at 32 hours of age.  MBU RN Requested assistance after weight check with 5% weight loss and poor feedings.  Baby is [redacted]w[redacted]d and 6#4oz.  Discussed with mom LPT babies and how her baby maybe be acting like this and need a little supplementation with feedings.  Mom has used DEBP and collected about .  Mom recently finished a feeding of about 15 minutes.  Assisted FOB with finger feedings about of colostrum.  FOB was able to burp baby.  Mom will wake baby every 3 hours as needed offer EBM as appetizer if needed to wake baby and then offer breast feeding for about 15-20 minutes.  FOB to follow feeding with 5-29mls EBM and mom to pump on preemie setting for 15 minutes followed by hand expression. Report given to mbu regarding moms feeding plan.  Mom to call for assist if baby is not feeding well overnight.    Patient Name: Boy Blakely Maranan [id number] Date: 02/04/2015 Reason for consult: Follow-up assessment   Maternal Data    Feeding Feeding Type: Breast Milk Length of feed: 15 min  LATCH Score/Interventions                      Lactation Tools Discussed/Used     Consult Status Consult Status: Follow-up Date: 02/05/15 Follow-up type: In-patient    Jannifer Rodney 02/04/2015, 10:47 PM

## 2015-02-04 NOTE — Discharge Instructions (Signed)
Places to get Circumcisions:    [facility] [phone] $[phone] by 4 wks  [facility] [phone] $244 by 4 wks  [facility] [phone] $175 by 2 wks  [facility] [phone] $250 by 7 days [facility] [phone] $[phone] by 4 wks

## 2015-02-04 NOTE — Progress Notes (Signed)
UR chart review completed.  

## 2015-02-04 NOTE — Anesthesia Postprocedure Evaluation (Signed)
  Anesthesia Post-op Note  Patient: Erin Cardenas  Procedure(s) Performed: * No procedures listed *  Patient Location: PACU and Mother/Baby  Anesthesia Type:Epidural  Level of Consciousness: awake, alert  and oriented  Airway and Oxygen Therapy: Patient Spontanous Breathing  Post-op Pain: mild  Post-op Assessment: Post-op Vital signs reviewed              Post-op Vital Signs: Reviewed and stable  Last Vitals:  Filed Vitals:   02/04/15 0515  BP: 127/69  Pulse: 102  Temp: 36.8 C  Resp: 18    Complications: No apparent anesthesia complications

## 2015-02-05 ENCOUNTER — Encounter (HOSPITAL_COMMUNITY): Payer: Self-pay

## 2015-02-05 DIAGNOSIS — Z30018 Encounter for initial prescription of other contraceptives: Secondary | ICD-10-CM

## 2015-02-05 HISTORY — PX: SUBDERMAL ETONOGESTREL IMPLANT INSERTION: PRO7525

## 2015-02-05 MED ORDER — LIDOCAINE HCL 1 % IJ SOLN
0.0000 mL | Freq: Once | INTRAMUSCULAR | Status: AC | PRN
Start: 2015-02-05 — End: 2015-02-05
  Administered 2015-02-05: 3 mL via INTRADERMAL
  Filled 2015-02-05: qty 20

## 2015-02-05 MED ORDER — IBUPROFEN 600 MG PO TABS: 600.0000 mg | ORAL_TABLET | Freq: Three times a day (TID) | ORAL | Status: DC | PRN

## 2015-02-05 MED ORDER — ETONOGESTREL 68 MG ~~LOC~~ IMPL
68.0000 mg | DRUG_IMPLANT | Freq: Once | SUBCUTANEOUS | Status: AC
  Administered 2015-02-05: 68 mg via SUBCUTANEOUS
  Filled 2015-02-05: qty 1

## 2015-02-05 NOTE — Lactation Note (Signed)
This note was copied from the chart of Erin Cardenas. Lactation Consultation Note  Patient Name: Erin Payslie Mccaig ZOXWR'[id number] Date: 02/05/2015 Reason for consult: Follow-up assessment Baby 50 hr old, mom requested LC help for latch. Baby was able to latch comfortably with no support from [facility]. Baby appears to be feeding well, is more awake, and is maintaining a better latch with a more rhythmic suck. Mom will f/u with the oldest bullet of 10 ml of pumped milk that she has. She will page lactation as needed.   Maternal Data    Feeding Feeding Type: Breast Fed Length of feed: 10 min (still going when LC left room )  LATCH Score/Interventions Latch: Grasps breast easily, tongue down, lips flanged, rhythmical sucking. Intervention(s): Adjust position  Audible Swallowing: A few with stimulation Intervention(s): Skin to skin Intervention(s): Hand expression  Type of Nipple: Everted at rest and after stimulation  Comfort (Breast/Nipple): Soft / non-tender     Hold (Positioning): No assistance needed to correctly position infant at breast. Intervention(s): Position options;Support Pillows  LATCH Score: 9  Lactation Tools Discussed/Used     Consult Status Consult Status: Follow-up Date: 02/06/15 Follow-up type: In-patient    Rulon Eisenmenger 02/05/2015, 4:06 PM

## 2015-02-05 NOTE — Lactation Note (Signed)
This note was copied from the chart of Erin Arpi Bradsher. Lactation Consultation Note; attempt to latch infant . Infant still very tired and unable to rouse enough to fed. Suggested that infant be bottle fed.  LC fed infant 15 ml of Ebm with slow flow nipple. Infant took feeding very slowly and needed stimulation to suck. Infant is a poor feeder. Advised mother to continue to attempt to breastfeed and then supplement with ebm after each feeding . Parents were given LPI  Parent instruction and teaching guidelines . Reviewed supplemental guidelines. Discussed that infant may need to be supplemented using a bottle to elicit suckling. Mother to page for next feeding assistance.   Patient Name: Erin Cardenas [id number] Date: 02/05/2015 Reason for consult: Follow-up assessment   Maternal Data    Feeding Feeding Type: Breast Fed Length of feed: 10 min (still going when LC left room )  LATCH Score/Interventions Latch: Grasps breast easily, tongue down, lips flanged, rhythmical sucking. Intervention(s): Adjust position  Audible Swallowing: A few with stimulation Intervention(s): Skin to skin Intervention(s): Hand expression  Type of Nipple: Everted at rest and after stimulation  Comfort (Breast/Nipple): Soft / non-tender     Hold (Positioning): No assistance needed to correctly position infant at breast. Intervention(s): Position options;Support Pillows  LATCH Score: 9  Lactation Tools Discussed/Used     Consult Status Consult Status: Follow-up Date: 02/06/15 Follow-up type: In-patient    Stevan Born [facility] 02/05/2015, 4:22 PM

## 2015-02-05 NOTE — Lactation Note (Signed)
This note was copied from the chart of Erin Cardenas. Lactation Consultation Note  Patient Name: Erin Jesse Hirst ZOXWR'[id number] Date: 02/05/2015 Reason for consult: Follow-up assessment Baby 36 hr old. Mom paged to see a feeding. Upon arrival baby seemed to have a shallow latch and was sleepy. Went over waking baby and getting a deeper latch. Mom will hold baby skin to skin and will page for lactation to see a better feeding before leaving the hospital. [facility] was unable to make apt, LC to call [facility] and fax referral.   Maternal Data    Feeding Feeding Type: Breast Fed Length of feed: 5 min  LATCH Score/Interventions Latch: Too sleepy or reluctant, no latch achieved, no sucking elicited. Intervention(s): Skin to skin;Teach feeding cues;Waking techniques Intervention(s): Adjust position;Assist with latch;Breast massage  Audible Swallowing: None Intervention(s): Skin to skin;Hand expression Intervention(s): Skin to skin;Hand expression  Type of Nipple: Everted at rest and after stimulation  Comfort (Breast/Nipple): Soft / non-tender     Hold (Positioning): Assistance needed to correctly position infant at breast and maintain latch. Intervention(s): Support Pillows;Position options;Skin to skin  LATCH Score: 5  Lactation Tools Discussed/Used Tools: Pump;Feeding cup;Medicine Dropper WIC Program:  (mom is trying to sign up for [facility] currently ) Pump Review: Setup, frequency, and cleaning;Milk Storage   Consult Status Consult Status: Follow-up Date: 02/05/15 Follow-up type: In-patient (call to see a better feed)    Rulon Eisenmenger 02/05/2015, 12:29 PM

## 2015-02-05 NOTE — Lactation Note (Signed)
This note was copied from the chart of Erin Valia Dupin. Lactation Consultation Note: Observed that mother has independently latched infant. Infant had a shallow latch with checks dimpling . Infant very sleepy . Assist mother with rousing infant. Multiple attempts to rouse infant. No latch sustained. Advised mother to pump breast for 20 mins. and call LC after pumping. Discussed with parents that infant needed extra calories of EBM. Parents taught suck training. Advised parents to feed infant every 2-3 hours.   Patient Name: Erin Cardenas [id number] Date: 02/05/2015 Reason for consult: Follow-up assessment   Maternal Data    Feeding Feeding Type: Breast Milk Length of feed:  (feeding over 20 minutes getting bottle)  LATCH Score/Interventions Latch: Too sleepy or reluctant, no latch achieved, no sucking elicited. Intervention(s): Skin to skin;Teach feeding cues;Waking techniques Intervention(s): Adjust position;Assist with latch;Breast massage  Audible Swallowing: None Intervention(s): Skin to skin;Hand expression Intervention(s): Skin to skin;Hand expression  Type of Nipple: Everted at rest and after stimulation  Comfort (Breast/Nipple): Soft / non-tender     Hold (Positioning): Assistance needed to correctly position infant at breast and maintain latch. Intervention(s): Support Pillows;Position options;Skin to skin  LATCH Score: 5  Lactation Tools Discussed/Used     Consult Status Consult Status: Follow-up Date: 02/05/15 Follow-up type: In-patient (call for next feeding )    Michel Bickers 02/05/2015, 3:58 PM

## 2015-02-05 NOTE — Discharge Summary (Signed)
Obstetric Discharge Summary Reason for Admission: induction of labor for gestational hypertension Prenatal Procedures: none Intrapartum Procedures: spontaneous vaginal delivery Postpartum Procedures: nexplanon Complications-Operative and Postpartum: none  At 2:03 PM a viable female was delivered via  (Presentation: occiput; anterior).  APGAR: 8, 9; weight pending.   Placenta status: intact, spontaneous.  Cord: 3 vessels with the following complications: none.    Anesthesia: Epidural  Episiotomy:  None Lacerations:  None Est. Blood Loss (mL):  130  Hospital Course:  Principal Problem:   Gestational hypertension w/o significant proteinuria in 3rd trimester Active Problems:   Supervision of normal pregnancy   Teen pregnancy   Obesity affecting pregnancy in third trimester   GBS (group B Streptococcus carrier), +RV culture, currently pregnant   Term pregnancy   Erin Cardenas is a 19 y.o. G1P1001 s/p IVD at 37+1.  Patient was admitted for induction due to gestational hypertension.  She has postpartum course that was uncomplicated including no problems with ambulating, PO intake, urination, pain, or bleeding. Patient had nexplanon placed prior to discharge. Plans to have outpatient circ. The pt feels ready to go home and  will be discharged with outpatient follow-up.   Today: No acute events overnight.  Pt denies problems with ambulating, voiding or po intake.  She denies nausea or vomiting.  Pain is well controlled.  She has had flatus. She has not had bowel movement.  Lochia Minimal.  Plan for birth control is  nexplanon.  Method of Feeding: breast  Physical Exam:  General: alert, cooperative, appears stated age and no distress Lochia: appropriate Uterine Fundus: firm Incision: none DVT Evaluation: No evidence of DVT seen on physical exam. Negative Homan's sign.  H/H: Lab Results  Component Value Date/Time   HGB 10.7* 02/03/2015 08:00 AM   HCT 32.2* 02/03/2015 08:00 AM     Discharge Diagnoses: Term Pregnancy-delivered  Discharge Information: Date: 02/05/2015 Activity: pelvic rest Diet: routine  Medications: PNV and Ibuprofen Breast feeding:  Yes Condition: stable Instructions: refer to handout Discharge to: home   Discharge Instructions    Baby Love Nurse Visit    Complete by:  As directed   Nursing visit within one week for blood pressure check at home. Patient with gHTN     Call MD for:  difficulty breathing, headache or visual disturbances    Complete by:  As directed      Call MD for:  extreme fatigue    Complete by:  As directed      Call MD for:  persistant dizziness or light-headedness    Complete by:  As directed      Call MD for:  persistant nausea and vomiting    Complete by:  As directed      Call MD for:  severe uncontrolled pain    Complete by:  As directed      Call MD for:  temperature >100.4    Complete by:  As directed      Diet - low sodium heart healthy    Complete by:  As directed      Discharge instructions    Complete by:  As directed   Home blood pressure check within 1 week by nurse coming to home, will be called to arrange. Follow up in 6 weeks for routine postpartum check. Nothing in vagina for 6 weeks including no tampons, no douching, no sexual intercourse. Nexplanon insertion prior to discharge.     Lifting restrictions    Complete by:  As directed  Weight restriction of 15 lbs.     Sexual acrtivity    Complete by:  As directed   No sexual activity until follow up visit in 6 weeks.            Medication List    TAKE these medications        ibuprofen 600 MG tablet  Commonly known as:  ADVIL,MOTRIN  Take 1 tablet (600 mg total) by mouth every 8 (eight) hours as needed for moderate pain or cramping.     PRENATAL 19 29-1 MG Chew  Chew 1 tablet by mouth daily.           Follow-up Information    Follow up with [facility] In 6 weeks.   Specialty:  Obstetrics and Gynecology   Why:   postpartum check   Contact information:   [address] [place] [address] [phone]      Durenda Hurt ,DO PGY2 Resident Physician 02/05/2015,7:24 AM  I was present for the exam and agree with above.  Benns Church, CNM 02/05/2015 8:05 AM

## 2015-02-05 NOTE — Lactation Note (Signed)
This note was copied from the chart of Erin Cardenas. Lactation Consultation Note  Patient Name: Erin Cardenas [id number] Date: 02/05/2015 Reason for consult: Follow-up assessment;Late preterm infant Baby 46 hr old, 37+1, DEBP, oral syringe. Mom reports baby was not latching well and she has been pumping before BF then feeding back after baby is done at the breast. She has 2 bullets of 20ml each on ice in her room at this time. Mom reports baby just fed, attempted latch but baby was too sleepy. Went over engorgement prevention/treatment, community resources, and lactation services.  Mom is currently on the phone with [facility], if she is able to make an apt she will be issued a [facility] loaner pump today. She made and OP apt for 02/11/15 at 230.   Maternal Data    Feeding Feeding Type: Breast Milk (oral syringe) Length of feed: 10 min  LATCH Score/Interventions Latch:  (mom reports baby just ate, did attempt latch, baby sleeping ) Intervention(s): Skin to skin Intervention(s): Adjust position;Assist with latch;Breast massage;Breast compression  Audible Swallowing: Spontaneous and intermittent Intervention(s): Skin to skin  Type of Nipple: Everted at rest and after stimulation  Comfort (Breast/Nipple): Soft / non-tender     Hold (Positioning): Assistance needed to correctly position infant at breast and maintain latch.  LATCH Score: 9  Lactation Tools Discussed/Used Tools: Pump;Feeding cup;Medicine Dropper WIC Program:  (mom is trying to sign up for [facility] currently ) Pump Review: Setup, frequency, and cleaning;Milk Storage   Consult Status Consult Status: Follow-up Date: 02/05/15 Follow-up type: In-patient (need to see feeding before DC and may get [facility] loaner)    Rulon Eisenmenger 02/05/2015, 11:25 AM

## 2015-02-05 NOTE — Procedures (Signed)
PRE-OP DIAGNOSIS: Desired long-term, reversible contraception   POST-OP DIAGNOSIS: Same   PROCEDURE: Nexplanon  placement  Performing Physician: Brayton Caves, MD   PROCEDURE:  Urine pregnancy test: N/A (one day post-partum) Written informed consent obtained Site (check): L arm        Sterile Preparation:          Betadine        [_]     Chloraprep             After a time-out, insertion site was selected 6 - 10 cm from medial epicondyle and marked. Procedure area was prepped and draped in a sterile fashion. 3 mL of 1% lidocaine without epinephrine was used for subcutaneous anesthesia. Anesthesia confirmed.  Nexplanon  trocar was inserted subcutaneously and then Nexplanon  capsule delivered subcutaneously. Trocar was removed from the insertion site. Nexplanon  capsule was palpated by provider and patient to assure satisfactory placement.  Estimated blood loss: minimal Dressings applied: pressure dressing Followup: The patient tolerated the procedure well without complications.  Standard post-procedure care wass explained and return precautions were given.

## 2015-02-10 ENCOUNTER — Ambulatory Visit (HOSPITAL_COMMUNITY): Payer: Medicaid Other

## 2015-02-11 ENCOUNTER — Ambulatory Visit (HOSPITAL_COMMUNITY): Admit: 2015-02-11 | Payer: Medicaid Other

## 2015-02-17 ENCOUNTER — Ambulatory Visit (HOSPITAL_COMMUNITY)
Admission: RE | Admit: 2015-02-17 | Discharge: 2015-02-17 | Disposition: A | Discharge status: Home / Self Care | Payer: Medicaid Other | Source: Ambulatory Visit | Attending: Family Medicine | Admitting: Family Medicine

## 2015-02-17 NOTE — Lactation Note (Signed)
Lactation Consult  Mother's reason for visit:  To get help with breastfeeding Visit Type:  Feeding assessment Appointment Notes:  37 weeks Consult:  Initial Lactation Consultant:  Pamelia Hoit  Mom had appointment here because baby was having difficulty latching while in the hospital. Born at 37 weeks. Mom reports baby is breast feeding much better now. Continues pumping some and bottle feeding EBM Using manual pump. Reports she now has WIC and plans to talk to them about getting a DEBP. Baby was fed shortly before coming to appointment but latched well and lots of swallows noted. Only nursed on one breast then off to sleep. Mom reports breast feels softer after nursing. No questions at present. To call prn ________________________________________________________________________  [id number]'N Name: Erin Cardenas. Date of Birth: 02/03/2015 Pediatrician: Dareen Piano Gender: female Gestational Age: [redacted]w[redacted]d (At Birth) Birth Weight: 6 lb 10 oz (3005 g) Weight at Discharge: Weight: 6 lb 4.9 oz (2860 g) (requested by [facility] )Date of Discharge: 02/05/2015 Filed Weights   02/03/15 1403 02/04/15 0035 02/04/15 2024  Weight: 6 lb 10 oz (3005 g) 6 lb 8.2 oz (2955 g) 6 lb 4.9 oz (2860 g)   Last weight taken from location outside of [facility]: 7-1 Location:  Ped office Weight today: 7- 3.7 oz  3280g       ________________________________________________________________________  Mother's Name: Erin Cardenas Type of delivery:   Breastfeeding Experience:  p1  ________________________________________________________________________  Breastfeeding History (Post Discharge)  Frequency of breastfeeding:  q  2-3 hrs Duration of feeding:  30 min  Supplementation  Formula:  Volume 0 ml         Breastmilk:  Volume 35 ml Frequency:  2-3 times/day Method:  Bottle,   Pumping  Type of pump:  Manual Frequency:  1-2 times/day Volume:  30-60 ml  Infant Intake and  Output Assessment  Voids:  6 in 24 hrs.  Color:  Clear yellow Stools:  5 in 24 hrs.  Color:  Yellow  ________________________________________________________________________  Maternal Breast Assessment  Breast:  Filling Nipple:  Erect _______________________________________________________________________ Feeding Assessment/Evaluation  Initial feeding assessment:  Infant's oral assessment:  WNL  Positioning:  Cross cradle Right breast  LATCH documentation:  Latch:  2 = Grasps breast easily, tongue down, lips flanged, rhythmical sucking.  Audible swallowing:  2 = Spontaneous and intermittent  Type of nipple:  2 = Everted at rest and after stimulation  Comfort (Breast/Nipple):  2 = Soft / non-tender  Hold (Positioning):  2 = No assistance needed to correctly position infant at breast  LATCH score:  10  Attached assessment:  Deep  Lips flanged:  Yes.    Lips untucked:  Yes.    Suck assessment:  Nutritive   Pre-feed weight:  3280 g  (7 lb. 3.7 oz.) Post-feed weight:  3336 g (7 lb. 5.7 oz.) Amount transferred:  56 ml Amount supplemented:  0 ml  Baby had 35 ml in bottle just prior to coming to appointment      Total amount transferred:  56 ml Total supplement given:  0 ml

## 2015-03-14 ENCOUNTER — Ambulatory Visit: Payer: Medicaid Other | Admitting: Medical

## 2015-06-26 ENCOUNTER — Emergency Department (HOSPITAL_COMMUNITY)
Admission: EM | Admit: 2015-06-26 | Discharge: 2015-06-26 | Disposition: A | Discharge status: Home / Self Care | Payer: Medicaid Other | Attending: Emergency Medicine | Admitting: Emergency Medicine

## 2015-06-26 DIAGNOSIS — I10 Essential (primary) hypertension: Secondary | ICD-10-CM | POA: Insufficient documentation

## 2015-06-26 DIAGNOSIS — Z79899 Other long term (current) drug therapy: Secondary | ICD-10-CM | POA: Insufficient documentation

## 2015-06-26 DIAGNOSIS — R21 Rash and other nonspecific skin eruption: Secondary | ICD-10-CM | POA: Insufficient documentation

## 2015-06-26 MED ORDER — HYDROCORTISONE 1 % EX CREA: TOPICAL_CREAM | CUTANEOUS | Status: DC

## 2015-06-26 MED ORDER — PREDNISONE 20 MG PO TABS: 40.0000 mg | ORAL_TABLET | Freq: Every day | ORAL | Status: DC

## 2015-06-26 NOTE — ED Provider Notes (Signed)
CSN: [id number]     Arrival date & time 06/26/15  1158 History  By signing my name below, I, Erin Cardenas, attest that this documentation has been prepared under the direction and in the presence of  [name], PA-C Electronically Signed: Charline Bills, ED Scribe 06/26/2015 at 12:235PM.   Chief Complaint  Patient presents with  . Rash   The history is provided by the patient. No language interpreter was used.   HPI Comments: Erin Cardenas is a 20 y.o. female who presents to the Emergency Department complaining of generalized pruritic rash first noticed 3 days. Pt suspects that rash may be attributed to new scented soap. She has tried Benadryl without significant relief. Pt denies new medications or detergents. She also denies pain or drainage from the rash, fever chills, nausea, vomiting, SOB. No other alleviating or aggravating factors.  Past Medical History  Diagnosis Date  . Hypertension    Past Surgical History  Procedure Laterality Date  . No past surgeries    . Subdermal etonogestrel implant insertion  02/05/2015        Family History  Problem Relation Age of Onset  . Hypertension Mother   . Cancer Maternal Grandmother    Social History  Substance Use Topics  . Smoking status: Never Smoker   . Smokeless tobacco: Never Used  . Alcohol Use: No   OB History    Gravida Para Term Preterm AB TAB SAB Ectopic Multiple Living   0 0 0 0 0 0 1     Review of Systems  Constitutional: Negative for fever and chills.  Respiratory: Negative for shortness of breath.   Gastrointestinal: Negative for nausea and vomiting.  Skin: Positive for rash.  All other systems reviewed and are negative.  Allergies  Review of patient's allergies indicates no known allergies.  Home Medications   Prior to Admission medications   Medication Sig Start Date End Date Taking? Authorizing Provider  hydrocortisone cream 1 % Apply to affected area 2 times daily 06/26/15   [name], PA-C   ibuprofen (ADVIL,MOTRIN) 600 MG tablet Take 1 tablet (600 mg total) by mouth every 8 (eight) hours as needed for moderate pain or cramping. 02/05/15   [name], DO  predniSONE (DELTASONE) 20 MG tablet Take 2 tablets (40 mg total) by mouth daily. 06/26/15   [name], PA-C  Prenatal Vit-Fe Fumarate-FA (PRENATAL 19) 29-1 MG CHEW Chew 1 tablet by mouth daily. 09/27/14   [name], MD   BP 127/63 mmHg  Pulse 88  Temp(Src) 98.6 F (37 C) (Oral)  Resp 18  SpO2 98% Physical Exam  Constitutional: She is oriented to person, place, and time. She appears well-developed and well-nourished.  HENT:  Head: Normocephalic and atraumatic.  Airway patent. Patient able to speak in clear full sentences without difficulty.   Eyes: Conjunctivae are normal. No scleral icterus.  Neck: Normal range of motion. No tracheal deviation present.  Pulmonary/Chest: Effort normal. No respiratory distress.  Abdominal: She exhibits no distension.  Musculoskeletal: Normal range of motion.  Neurological: She is alert and oriented to person, place, and time.  Skin: Skin is warm and dry. Rash noted.  Generalized maculopapular rash with mild erythema.  Evidence of excoriation.  No signs of infection.  Psychiatric: She has a normal mood and affect. Her behavior is normal.   ED Course  Procedures (including critical care time) DIAGNOSTIC STUDIES: Oxygen Saturation is 98% on RA, normal by my interpretation.  COORDINATION OF CARE: 12:23 PM-Discussed treatment plan which includes Prednisone and hydrocortisone cream with pt at bedside and pt agreed to plan.   Labs Review Labs Reviewed - No data to display  Imaging Review No results found.   EKG Interpretation None      MDM   Final diagnoses:  Rash    Patient presents with rash most likely contact dermatitis.  Doubt anaphylaxis. VSS, NAD.  Patient recently started using new scented soap.  Patient advised to discontinue and use gentle unscented products.   Patient advised to continue Benadryl for itching. Discharged home with hydrocortisone cream and prednisone. Follow up PCP.  Discussed return precautions.  Patient agrees and acknowledges the above plan for discharge.  I personally performed the services described in this documentation, which was scribed in my presence. The recorded information has been reviewed and is accurate.      [name], PA-C 06/26/15 1242  Loren Racer, MD 06/26/15 [phone]

## 2015-06-26 NOTE — Discharge Instructions (Signed)
Contact Dermatitis    Dermatitis is redness, soreness, and swelling (inflammation) of the skin. Contact dermatitis is a reaction to certain substances that touch the skin. There are two types of contact dermatitis:  Irritant contact dermatitis. This type is caused by something that irritates your skin, such as dry hands from washing them too much. This type does not require previous exposure to the substance for a reaction to occur. This type is more common.  Allergic contact dermatitis. This type is caused by a substance that you are allergic to, such as a nickel allergy or poison ivy. This type only occurs if you have been exposed to the substance (allergen) before. Upon a repeat exposure, your body reacts to the substance. This type is less common. CAUSES  Many different substances can cause contact dermatitis. Irritant contact dermatitis is most commonly caused by exposure to:  Makeup.  Soaps.  Detergents.  Bleaches.  Acids.  Metal salts, such as nickel.  Allergic contact dermatitis is most commonly caused by exposure to:  Poisonous plants.  Chemicals.  Jewelry.  Latex.  Medicines.  Preservatives in products, such as clothing.  RISK FACTORS  This condition is more likely to develop in:  People who have jobs that expose them to irritants or allergens.  People who have certain medical conditions, such as asthma or eczema.  SYMPTOMS  Symptoms of this condition may occur anywhere on your body where the irritant has touched you or is touched by you. Symptoms include:  Dryness or flaking.  Redness.  Cracks.  Itching.  Pain or a burning feeling.  Blisters.  Drainage of Lazarz amounts of blood or clear fluid from skin cracks. With allergic contact dermatitis, there may also be swelling in areas such as the eyelids, mouth, or genitals.  DIAGNOSIS  This condition is diagnosed with a medical history and physical exam. A patch skin test may be performed to help determine the cause. If the  condition is related to your job, you may need to see an occupational medicine specialist.  TREATMENT  Treatment for this condition includes figuring out what caused the reaction and protecting your skin from further contact. Treatment may also include:  Steroid creams or ointments. Oral steroid medicines may be needed in more severe cases.  Antibiotics or antibacterial ointments, if a skin infection is present.  Antihistamine lotion or an antihistamine taken by mouth to ease itching.  A bandage (dressing). HOME CARE INSTRUCTIONS  Skin Care  Moisturize your skin as needed.  Apply cool compresses to the affected areas.  Try taking a bath with:  Epsom salts. Follow the instructions on the packaging. You can get these at your local pharmacy or grocery store.  Baking soda. Pour a Jackson amount into the bath as directed by your health care provider.  Colloidal oatmeal. Follow the instructions on the packaging. You can get this at your local pharmacy or grocery store. Try applying baking soda paste to your skin. Stir water into baking soda until it reaches a paste-like consistency.  Do not scratch your skin.  Bathe less frequently, such as every other day.  Bathe in lukewarm water. Avoid using hot water. Medicines  Take or apply over-the-counter and prescription medicines only as told by your health care provider.  If you were prescribed an antibiotic medicine, take or apply your antibiotic as told by your health care provider. Do not stop using the antibiotic even if your condition starts to improve. General Instructions  Keep all follow-up visits as  told by your health care provider. This is important.  Avoid the substance that caused your reaction. If you do not know what caused it, keep a journal to try to track what caused it. Write down:  What you eat.  What cosmetic products you use.  What you drink.  What you wear in the affected area. This includes jewelry. If you were given a  dressing, take care of it as told by your health care provider. This includes when to change and remove it. SEEK MEDICAL CARE IF:  Your condition does not improve with treatment.  Your condition gets worse.  You have signs of infection such as swelling, tenderness, redness, soreness, or warmth in the affected area.  You have a fever.  You have new symptoms. SEEK IMMEDIATE MEDICAL CARE IF:  You have a severe headache, neck pain, or neck stiffness.  You vomit.  You feel very sleepy.  You notice red streaks coming from the affected area.  Your bone or joint underneath the affected area becomes painful after the skin has healed.  The affected area turns darker.  You have difficulty breathing. This information is not intended to replace advice given to you by your health care provider. Make sure you discuss any questions you have with your health care provider.  Document Released: 05/07/2000 Document Revised: 01/29/2015 Document Reviewed: 09/25/2014  Elsevier Interactive Patient Education 2016 [employer].    Emergency Department Resource Guide 1) Find a Doctor and Pay Out of Pocket Although you won't have to find out who is covered by your insurance plan, it is a good idea to ask around and get recommendations. You will then need to call the office and see if the doctor you have chosen will accept you as a new patient and what types of options they offer for patients who are self-pay. Some doctors offer discounts or will set up payment plans for their patients who do not have insurance, but you will need to ask so you aren't surprised when you get to your appointment.  2) Contact Your Local Health Department Not all health departments have doctors that can see patients for sick visits, but many do, so it is worth a call to see if yours does. If you don't know where your local health department is, you can check in your phone book. The CDC also has a tool to help you locate your state's health  department, and many state websites also have listings of all of their local health departments.  3) Find a Walk-in Clinic If your illness is not likely to be very severe or complicated, you may want to try a walk in clinic. These are popping up all over the country in pharmacies, drugstores, and shopping centers. They're usually staffed by nurse practitioners or physician assistants that have been trained to treat common illnesses and complaints. They're usually fairly quick and inexpensive. However, if you have serious medical issues or chronic medical problems, these are probably not your best option.  No Primary Care Doctor: - Call Health Connect at  [phone] - they can help you locate a primary care doctor that  accepts your insurance, provides certain services, etc. - Physician Referral Service- [phone]  Chronic Pain Problems: Organization         Address  Phone   Notes  [facility]  [phone] Patients need to be referred by their primary care doctor.   Medication Assistance: Organization  Address  Phone   Notes  [facility] Medication [facility] [address] [place] [place], [place] [address] [phone] --Must be a resident of [facility] -- Must have NO insurance coverage whatsoever (no Medicaid/ Medicare, etc.) -- The pt. MUST have a primary care doctor that directs their care regularly and follows them in the community   [facility]  [phone]   [employer]  [phone]    Agencies that provide inexpensive medical care: Organization         Address  Phone   Notes  [facility]  [phone]   [facility]    [phone]   [facility] [address] [place], [place] [address] [phone]   [facility] [address] [place] [place] [phone]   Planned Parenthood    [phone]   [facility]    [phone]    [facility]  [address], [place] Phone:  [phone], Fax:  [phone] Hours of Operation:  9 am - 6 pm, M-F.  Also accepts Medicaid/Medicare and self-pay.  [facility]  [address], [place] Phone: [phone], Fax: [phone]. Hours of Operation:  8:30 am - 5:30 pm, M-F.  Also accepts Medicaid and self-pay.  [facility] [place] [address], [place] Phone: [phone]   [facility] [address] Natasha Bence [place], [place] [phone], Ext. [phone] Mondays & Thursdays: 7-9 AM.  First 15 patients are seen on a first come, first serve basis.    Medicaid-accepting [facility] Providers:  Organization         Address  Phone   Notes  [facility] [address],  [phone] Also accepts self-pay patients.  [facility] [address] Laurell Josephs [place], [place]  [phone]   [facility] [address], [place] [phone]   [facility] [address] [place] [phone]   Renaye Rakers [address], [place]   [phone] Only accepts [place] Access [place] patients after they have their name applied to their card.   Self-Pay (no insurance) in [facility]:  Organization         Address  Phone   Notes  Sickle Cell Patients, [facility] Internal Medicine [address] [place], [place] [phone]   [facility] [address] [place], [place] [phone]   [facility] [place]  [address],  [phone]   [facility]/Dr. Osei-Bonsu  [address] [place] or [address], [place] [phone] Phone number for both [place] and [place] locations is the same.  [facility] and [facility] [address] [place] [phone]    [facility] [address], [place] or [address] [phone]   [facility] [address], [place] [phone], phone; [phone], fax Sees patients 1st and 3rd Saturday of every month.  Must not qualify for public or private insurance (i.e. Medicaid, Medicare, [place] Health Choice, Veterans' Benefits)  Household income should be no more than 200% of the poverty level The clinic cannot treat you if you are pregnant or think you  are pregnant  Sexually transmitted diseases are not treated at the clinic.    Dental Care: Organization         Address  Phone  Notes  [facility] Department of [facility] [address] [place], [place] [phone] Accepts children up to age 9 who are enrolled in [place] or [place] Health Choice; pregnant women with a Medicaid card; and children who have applied for Medicaid or [place] Health Choice, but were declined, whose parents can pay a reduced fee at time of service.  [facility] of [facility]  [address], [place] [phone] Accepts children up to age 37 who are enrolled in [place] or [place] Health Choice; pregnant women with a Medicaid card; and children who have applied for Medicaid or  Health Choice, but were declined, whose parents can pay a reduced fee at time of service.  [facility]  [address] [place], [place] [phone] Patients are seen by appointment only. Walk-ins are not accepted. [facility] will see patients 95 years of age and older. Monday - Tuesday (8am-5pm) Most Wednesdays (8:30-5pm) $30 per visit, cash only  [facility] Adult Dental Access PRO[facility]  [address], [facility] [phone] Patients are seen by appointment only. Walk-ins are not accepted. [facility] will see patients 61 years of age and older. One Wednesday Evening (Monthly: Volunteer Based).   $30 per visit, cash only  [employer]  [phone] for adults; Children under age 37, call Graduate Pediatric Dentistry at [phone]. Children aged 2-14, please call [phone] to request a pediatric application.  Dental services are provided in all areas of dental care including fillings, crowns and bridges, complete and partial dentures, implants, gum treatment, root canals, and extractions. Preventive care is also provided. Treatment is provided to both adults and children. Patients are selected via a lottery and there is often a waiting list.   [facility] [address], [place]  [phone] [email or web address]   [facility] [address] [place], [place] [phone], Ext. [phone] Second and Fourth Thursday of each month, opens at 6:30 AM; Clinic ends at 9 AM.  Patients are seen on a first-come first-served basis, and a limited number are seen during each clinic.   [facility]  [address] Ether Griffins [place], [place] [phone]   Eligibility Requirements You must have lived in [place], [place], or [place] counties for at least the last three months.   You cannot be eligible for state or federal sponsored [place], including [employer], [place], or [employer].   You generally cannot be eligible for healthcare insurance through your employer.    How to apply: Eligibility screenings are held every Tuesday and Wednesday afternoon from 1:00 pm until 4:00 pm. You do not need an appointment for the interview!  [facility] [address] [place], [place] [phone]   [facility] Health Department  [phone]   [facility] Department  [phone]   [facility] Health Department  [phone]    Behavioral Health Resources in the Community: Intensive Outpatient Programs Organization         Address  Phone  Notes  [facility] [address], [place], [place] [phone]   [facility] Outpatient [address] [place], [place] [phone]   [facility] [address], [place], [place]  [phone]   [facility] [address]  [place], [place] [phone] or [phone]   Substance Abuse Resources Organization         Address  Phone  Notes  [facility] Drug Services  [phone]   [facility]  [phone]   [facility]  [phone]   Floydene Flock  [phone]   Residential & Outpatient Substance Abuse Program  [phone]   Psychological Services Organization         Address  Phone  Notes  [facility] [place] [facility] Behavioral Health  [phone]   [facility]  [phone]   [facility] Mental Health [address], [place] [phone] or [phone]    Mobile Crisis Teams Organization         Address  Phone  Notes  [facility], Mobile Crisis Care Unit  [phone]   [facility]  [address] [place], [place] [phone]   Doristine Locks [address] [place] [phone]    Self-Help/Support Groups Organization         Address  Phone             Notes  [facility] [place] - variety of support groups  336- I7437963 Call for more information  Narcotics Anonymous (NA), [facility] [address], [employer] [place]  2 meetings at this location   Statistician         Address  Phone  Notes  [facility] [address],    [place]  [phone]   [facility]  [address], [place] [address], [place], [place] [phone]   [facility] [address] [place], [place] [phone] Admissions: 8am-3pm M-F  [facility] [address]    [place], [place] [phone]   [facility] [address] [place], [place], [place] [phone]   [facility] [address],  [place], [place] [phone]   [facility] - Intensive Outpatient [address] Laurell Josephs [address], [place], [place] [phone]   [facility] ([facility] [address] [place]  [place], [place] [phone] or [phone]   [facility] ([facility]) [address], [place], [place] [phone] Accepts Medicaid  [facility] [address]  [place] [phone] Substance Abuse/Addiction Treatment   [facility] Organization         Address  Phone  Notes  [facility]  [phone]   Angie Fava, PhD [address] Ervin Knack [place], [place]   [phone] or [phone]   [facility] Behavioral   [address] [place], [place] [phone]   [facility] [address] [place], [place] [phone] Insurance/Medicaid/sponsorship through [facility] and Families [address]                                    [place], [place] [phone] Therapy/tele-psych/case  [facility] [address], [place] [phone]    Dr. Lolly Mustache  [phone]   [facility] [place]  [employer] [facility] Dept. 1) [address], [place] 2) [address] [place] 3)  [address], [place] [phone]  [phone]   [facility] Child Abuse Hotline (304) 786-5328)  342-1394 or (336) 342-3537 (After Hours)    ° ° ° °

## 2015-06-26 NOTE — ED Notes (Addendum)
Patient here with 3 days of itching to arms, Linhares red area noted to arm, thinks it related to using new soap. Took benadryl earlier in week. Reports same red areas to legs

## 2015-11-05 ENCOUNTER — Ambulatory Visit (HOSPITAL_COMMUNITY)
Admission: EM | Admit: 2015-11-05 | Discharge: 2015-11-05 | Disposition: A | Discharge status: Home / Self Care | Payer: Medicaid Other | Attending: Emergency Medicine | Admitting: Emergency Medicine

## 2015-11-05 ENCOUNTER — Encounter (HOSPITAL_COMMUNITY): Payer: Self-pay | Admitting: Emergency Medicine

## 2015-11-05 DIAGNOSIS — L03031 Cellulitis of right toe: Secondary | ICD-10-CM

## 2015-11-05 DIAGNOSIS — H00016 Hordeolum externum left eye, unspecified eyelid: Secondary | ICD-10-CM | POA: Diagnosis not present

## 2015-11-05 DIAGNOSIS — L03032 Cellulitis of left toe: Secondary | ICD-10-CM

## 2015-11-05 MED ORDER — CEPHALEXIN 250 MG PO CAPS: 250.0000 mg | ORAL_CAPSULE | Freq: Four times a day (QID) | ORAL | Status: DC

## 2015-11-05 MED ORDER — POLYMYXIN B-TRIMETHOPRIM 10000-0.1 UNIT/ML-% OP SOLN: 1.0000 [drp] | Freq: Four times a day (QID) | OPHTHALMIC | Status: DC

## 2015-11-05 NOTE — Discharge Instructions (Signed)
You have a stye. This is basically a blocked tear gland. Use the Polytrim eyedrops 4 times a day for the next 5 days. Apply warm compresses as often as you can, but at least 3 times a day. This should gradually improve over the next several days.  You do have some infection around the toenails.  Soak your toes in warm soapy water or Epsom salts 3 times a day. Take Keflex 4 times a day for 1 week. Follow-up as needed.

## 2015-11-05 NOTE — ED Provider Notes (Signed)
CSN: [id number]     Arrival date & time 11/05/15  1300 History   First MD Initiated Contact with Patient 11/05/15 1409     Chief Complaint  Patient presents with  . Eye Problem  . Toe Pain   (Consider location/radiation/quality/duration/timing/severity/associated sxs/prior Treatment) HPI She is a 20 year old woman here for evaluation of left eyelid swelling and toe issue. She states she noticed a little swelling in her left lower eyelid 2 or 3 days ago. This morning, it became more swollen and painful. She denies any change in her vision. No eye redness or drainage. She does have a little bit of discomfort in the eyelid.  She also reports a two-week history of intermittent bleeding and drainage from her bilateral big toes. She states this started after getting a pedicure. She has not done anything particular for her feet.  Past Medical History  Diagnosis Date  . Hypertension    Past Surgical History  Procedure Laterality Date  . No past surgeries    . Subdermal etonogestrel implant insertion  02/05/2015        Family History  Problem Relation Age of Onset  . Hypertension Mother   . Cancer Maternal Grandmother    Social History  Substance Use Topics  . Smoking status: Never Smoker   . Smokeless tobacco: Never Used  . Alcohol Use: No   OB History    Gravida Para Term Preterm AB TAB SAB Ectopic Multiple Living   0 0 0 0 0 0 1     Review of Systems As in history of present illness Allergies  Review of patient's allergies indicates no known allergies.  Home Medications   Prior to Admission medications   Medication Sig Start Date End Date Taking? Authorizing Provider  cephALEXin (KEFLEX) 250 MG capsule Take 1 capsule (250 mg total) by mouth 4 (four) times daily. 11/05/15   Charm Rings, MD  hydrocortisone cream 1 % Apply to affected area 2 times daily 06/26/15   [name], PA-C  ibuprofen (ADVIL,MOTRIN) 600 MG tablet Take 1 tablet (600 mg total) by mouth every 8  (eight) hours as needed for moderate pain or cramping. 02/05/15   [name], DO  Prenatal Vit-Fe Fumarate-FA (PRENATAL 19) 29-1 MG CHEW Chew 1 tablet by mouth daily. 09/27/14   [name], MD  trimethoprim-polymyxin b (POLYTRIM) ophthalmic solution Place 1 drop into the left eye 4 (four) times daily. For 5 days 11/05/15   Charm Rings, MD   Meds Ordered and Administered this Visit  Medications - No data to display  BP 138/66 mmHg  Pulse 80  Temp(Src) 99 F (37.2 C) (Oral)  Resp 16  SpO2 100%  Breastfeeding? Yes No data found.   Physical Exam  Constitutional: She is oriented to person, place, and time. She appears well-developed and well-nourished. No distress.  Eyes: Conjunctivae and EOM are normal. Pupils are equal, round, and reactive to light. Right eye exhibits no discharge. Left eye exhibits no discharge.  Olivares tender nodule in left lower eyelid with surrounding swelling.  Cardiovascular: Normal rate.   Pulmonary/Chest: Effort normal.  Neurological: She is alert and oriented to person, place, and time.  Skin:  Left great toe: There is some swelling and tenderness to the lateral nail edge. There does appear to be some dried. One drainage. Right great toe: There is some dried bloody discharge on the lateral nail edge. This is not particularly tender.    ED Course  Procedures (including critical care time)  Labs Review Labs Reviewed - No data to display  Imaging Review No results found.   MDM   1. Stye, left   2. Paronychia of toe, left   3. Paronychia of toe of right foot    Polytrim and warm compresses for stye. Keflex and warm soaks for paronychia. Follow-up as needed.    Charm RingsErin J Heidee Audi, MD 11/05/15 [phone]

## 2015-11-05 NOTE — ED Notes (Signed)
[facility] a [facility] of left eyelid swelling that started 2 days ago and was worse this am. The patient also had a complaint of pain and drainage to both of her great toes after getting a pedicure.

## 2015-11-15 ENCOUNTER — Encounter (HOSPITAL_COMMUNITY): Payer: Self-pay

## 2015-11-15 ENCOUNTER — Emergency Department (HOSPITAL_COMMUNITY)
Admission: EM | Admit: 2015-11-15 | Discharge: 2015-11-15 | Disposition: A | Discharge status: Home / Self Care | Payer: Medicaid Other | Attending: Emergency Medicine | Admitting: Emergency Medicine

## 2015-11-15 DIAGNOSIS — I10 Essential (primary) hypertension: Secondary | ICD-10-CM | POA: Diagnosis not present

## 2015-11-15 DIAGNOSIS — Z79899 Other long term (current) drug therapy: Secondary | ICD-10-CM | POA: Insufficient documentation

## 2015-11-15 DIAGNOSIS — H00015 Hordeolum externum left lower eyelid: Secondary | ICD-10-CM | POA: Insufficient documentation

## 2015-11-15 DIAGNOSIS — H00016 Hordeolum externum left eye, unspecified eyelid: Secondary | ICD-10-CM

## 2015-11-15 NOTE — Discharge Instructions (Signed)
You have been seen today for a stye. You seem to have been doing the correct treatment as directed. Continue with the warm compresses. Follow-up with ophthalmology as soon as possible. Call the number provided to set up an appointment. Follow up with PCP as needed.

## 2015-11-15 NOTE — ED Notes (Signed)
Stye to left lower eyelid x 1 week, pain to same. Denies trauma

## 2015-11-15 NOTE — ED Provider Notes (Signed)
CSN: [id number]     Arrival date & time 11/15/15  1920 History  By signing my name below, I, Soijett Blue, attest that this documentation has been prepared under the direction and in the presence of Shawn Joy, PA-C Electronically Signed: Soijett Blue, ED Scribe. 11/15/2015. 7:38 PM.    Chief Complaint  Patient presents with  . Stye      The history is provided by the patient. No language interpreter was used.    HPI Comments: Erin Cardenas is a 20 y.o. female with a medical hx of HTN who presents to the Emergency Department complaining of stye to the left eye onset 1.5 week. Pt reports that the area where her stye is was initially red, and she noticed a white head to the area 3 days ago. Pt notes that she has used warm compresses 4 times a day followed by eye drops. Pt states that she has pain to the area and a burning sensation. She states that she has tried only the warm compresses and polytrim drops for the relief for her symptoms. She denies eye discharge, fever/chills, visual disturbances, facial swelling, and any other symptoms.   Per pt chart review: Pt was seen in the urgent care on 11/05/2015 for left eye problem. Pt was dx with a stye and Rx polytrim and advised to use warm compresses.    Past Medical History  Diagnosis Date  . Hypertension    Past Surgical History  Procedure Laterality Date  . No past surgeries    . Subdermal etonogestrel implant insertion  02/05/2015        Family History  Problem Relation Age of Onset  . Hypertension Mother   . Cancer Maternal Grandmother    Social History  Substance Use Topics  . Smoking status: Never Smoker   . Smokeless tobacco: Never Used  . Alcohol Use: No   OB History    Gravida Para Term Preterm AB TAB SAB Ectopic Multiple Living   1 1 1  0 0 0 0 0 0 1     Review of Systems  Constitutional: Negative for fever and chills.  Eyes: Negative for discharge and visual disturbance.       Stye to left eye.        Allergies  Review of patient's allergies indicates no known allergies.  Home Medications   Prior to Admission medications   Medication Sig Start Date End Date Taking? Authorizing Provider  cephALEXin (KEFLEX) 250 MG capsule Take 1 capsule (250 mg total) by mouth 4 (four) times daily. 11/05/15  Yes Charm RingsErin J Honig, MD  Prenatal Vit-Fe Fumarate-FA (PRENATAL 19) 29-1 MG CHEW Chew 1 tablet by mouth daily. 09/27/14  Yes Adam PhenixJames G Arnold, MD   BP 134/80 mmHg  Pulse 85  Temp(Src) 98.4 F (36.9 C) (Oral)  Resp 18  SpO2 100% Physical Exam  Constitutional: She appears well-developed and well-nourished.  HENT:  Head: Normocephalic and atraumatic.  No facial swelling.   Eyes: Conjunctivae and EOM are normal. Pupils are equal, round, and reactive to light. Left eye exhibits hordeolum.  Frysinger hordeolum to the left lower eyelid. No intrusion into the inner lid. No abnormality noted to the conjunctiva or globe.   Neck: Neck supple.  Cardiovascular: Normal rate.   Pulmonary/Chest: Effort normal.  Abdominal: Soft. She exhibits no distension.  Musculoskeletal: Normal range of motion.  Neurological: She is alert.  Skin: Skin is warm and dry.  Psychiatric: She has a normal mood and affect. Her behavior  is normal.  Nursing note and vitals reviewed.   ED Course  Procedures (including critical care time) DIAGNOSTIC STUDIES: Oxygen Saturation is 100% on RA, nl by my interpretation.    COORDINATION OF CARE: 7:36 PM Discussed treatment plan with pt at bedside which includes continue warm compresses and polytrim drops, referral to opthamalogist, and pt agreed to plan.    MDM   Final diagnoses:  Hordeolum external, left    Pt presents with a hordeolum to the left lower eyelid. Pt was seen prior at urgent care and Rx polytrim and advised use of warm compresses. Seems patient has had a  failure of outpatient therapy, follow up with opthamology for possible I&D. I&D not performed in ED due to  proximity to globe. Patient has no systemic symptoms or signs of cellulitis. Discussed return precautions. Pt appears safe for discharge.    I personally performed the services described in this documentation, which was scribed in my presence. The recorded information has been reviewed and is accurate.   Anselm PancoastShawn C Joy, PA-C 11/15/15 2012  Vanetta MuldersScott Zackowski, MD 11/19/15 [phone]

## 2016-07-04 ENCOUNTER — Ambulatory Visit (HOSPITAL_COMMUNITY)
Admission: EM | Admit: 2016-07-04 | Discharge: 2016-07-04 | Disposition: A | Discharge status: Home / Self Care | Payer: Medicaid Other | Attending: Internal Medicine | Admitting: Internal Medicine

## 2016-07-04 ENCOUNTER — Encounter (HOSPITAL_COMMUNITY): Payer: Self-pay | Admitting: Emergency Medicine

## 2016-07-04 DIAGNOSIS — J029 Acute pharyngitis, unspecified: Secondary | ICD-10-CM | POA: Diagnosis not present

## 2016-07-04 DIAGNOSIS — J039 Acute tonsillitis, unspecified: Secondary | ICD-10-CM

## 2016-07-04 MED ORDER — PREDNISONE 50 MG PO TABS: ORAL_TABLET | ORAL | 0 refills | Status: DC

## 2016-07-04 MED ORDER — AMOXICILLIN 500 MG PO CAPS: 1000.0000 mg | ORAL_CAPSULE | Freq: Two times a day (BID) | ORAL | 0 refills | Status: DC

## 2016-07-04 NOTE — ED Provider Notes (Signed)
CSN: [id number]     Arrival date & time 07/04/16  1312 History   First MD Initiated Contact with Patient 07/04/16 1540     Chief Complaint  Patient presents with  . Sore Throat   (Consider location/radiation/quality/duration/timing/severity/associated sxs/prior Treatment) 21 year old female awoke this morning with a sore throat. She is complaining of swelling in her throat and looked in the mirror and saw exudates. Denies any known fever.      Past Medical History:  Diagnosis Date  . Hypertension    Past Surgical History:  Procedure Laterality Date  . NO PAST SURGERIES    . SUBDERMAL ETONOGESTREL IMPLANT INSERTION  02/05/2015       Family History  Problem Relation Age of Onset  . Hypertension Mother   . Cancer Maternal Grandmother    Social History  Substance Use Topics  . Smoking status: Never Smoker  . Smokeless tobacco: Never Used  . Alcohol use No   OB History    Gravida Para Term Preterm AB Living   1 1 1  0 0 1   SAB TAB Ectopic Multiple Live Births   0 0 0 0 1     Review of Systems  Constitutional: Positive for activity change and appetite change.  HENT: Positive for postnasal drip and sore throat.   Respiratory: Negative.  Negative for shortness of breath.   Cardiovascular: Negative for chest pain.  Gastrointestinal: Negative.   All other systems reviewed and are negative.   Allergies  Patient has no known allergies.  Home Medications   Prior to Admission medications   Medication Sig Start Date End Date Taking? Authorizing Provider  amoxicillin (AMOXIL) 500 MG capsule Take 2 capsules (1,000 mg total) by mouth 2 (two) times daily. 07/04/16   Hayden Rasmussen, NP  predniSONE (DELTASONE) 50 MG tablet 1 tab po daily for 3 days. Take with food. 07/04/16   Hayden Rasmussen, NP   Meds Ordered and Administered this Visit  Medications - No data to display  BP 126/64 (BP Location: Right Arm)   Pulse (!) 121   Temp 98.7 F (37.1 C) (Oral)   Resp 20   SpO2 100%  No  data found.   Physical Exam  Constitutional: She is oriented to person, place, and time. She appears well-developed and well-nourished. No distress.  HENT:  Head: Normocephalic and atraumatic.  Right Ear: External ear normal.  Left Ear: External ear normal.  Oropharynx with enlarged erythematous cryptic palatine tonsils covered with gray exudates. Posterior pharynx with erythema and exudates. Positive for thick clear PND. Odor during examination typical of that which is experienced with bacterial type of infection.  Eyes: EOM are normal. Pupils are equal, round, and reactive to light.  Neck: Normal range of motion. Neck supple.  Cardiovascular: Normal rate, regular rhythm and normal heart sounds.   Pulmonary/Chest: Effort normal and breath sounds normal. No respiratory distress.  Musculoskeletal: Normal range of motion. She exhibits no edema.  Lymphadenopathy:    She has cervical adenopathy.  Neurological: She is alert and oriented to person, place, and time.  Skin: Skin is warm and dry.  Nursing note and vitals reviewed.   Urgent Care Course     Procedures (including critical care time)  Labs Review Labs Reviewed - No data to display  Imaging Review No results found.   Visual Acuity Review  Right Eye Distance:   Left Eye Distance:   Bilateral Distance:    Right Eye Near:   Left Eye Near:  Bilateral Near:         MDM   1. Exudative tonsillitis   2. Pharyngitis, unspecified etiology    Take the antibiotic as directed. Also you have been prescribe some prednisone to help with the swelling in the back of the throat. In regards to the ibuprofen do not take the high doses as initially discussed. He may take 400 mg twice a day for the first 3 days then you can go to 600 mg every 6-8 hours for pain relief. When taking these medicines take with food to protect her stomach. Also recommend taking an antihistamine such as Zyrtec or Allegra to help with the drainage in your  throat. Drink plenty of liquids particularly cold clear liquids. Cepacol lozenges for sore throat pain. Meds ordered this encounter  Medications  . predniSONE (DELTASONE) 50 MG tablet    Sig: 1 tab po daily for 3 days. Take with food.    Dispense:  3 tablet    Refill:  0    Order Specific Question:   Supervising Provider    Answer:   Eustace MooreMURRAY, LAURA W [[id number]]  . amoxicillin (AMOXIL) 500 MG capsule    Sig: Take 2 capsules (1,000 mg total) by mouth 2 (two) times daily.    Dispense:  40 capsule    Refill:  0    Order Specific Question:   Supervising Provider    Answer:   Eustace MooreMURRAY, LAURA W [[id number]]   Verbal and written discharge instructions have been reviewed and given to the patient  by the provider to include medications and other care measures.     Hayden Rasmussenavid Othon Guardia, NP 07/04/16 1610    Hayden Rasmussenavid Zella Dewan, NP 07/04/16 [phone]

## 2016-07-04 NOTE — ED Triage Notes (Signed)
[facility] with a complaint of a sore throat that started this am. The patient denied a fever at home.

## 2016-07-04 NOTE — Discharge Instructions (Signed)
Take the antibiotic as directed. Also you have been prescribe some prednisone to help with the swelling in the back of the throat. In regards to the ibuprofen do not take the high doses as initially discussed. He may take 400 mg twice a day for the first 3 days then you can go to 600 mg every 6-8 hours for pain relief. When taking these medicines take with food to protect her stomach. Also recommend taking an antihistamine such as Zyrtec or Allegra to help with the drainage in your throat. Drink plenty of liquids particularly cold clear liquids. Cepacol lozenges for sore throat pain.

## 2016-09-11 IMAGING — US US OB COMP LESS 14 WK
1 series · 14 of 28 positions shown · non-contrast
Comparison: None.

CLINICAL DATA: Vaginal bleeding. First trimester pregnancy with
uncertain fetal viability.

EXAM:
OBSTETRIC <14 WK US AND TRANSVAGINAL OB US
TECHNIQUE: Both transabdominal and transvaginal ultrasound examinations were
performed for complete evaluation of the gestation as well as the
maternal uterus, adnexal regions, and pelvic cul-de-sac.
Transvaginal technique was performed to assess early pregnancy.

[Series 1: us ob comp less 14 wks · 39 acquisitions, 14 frames shown]
[im 2/39]
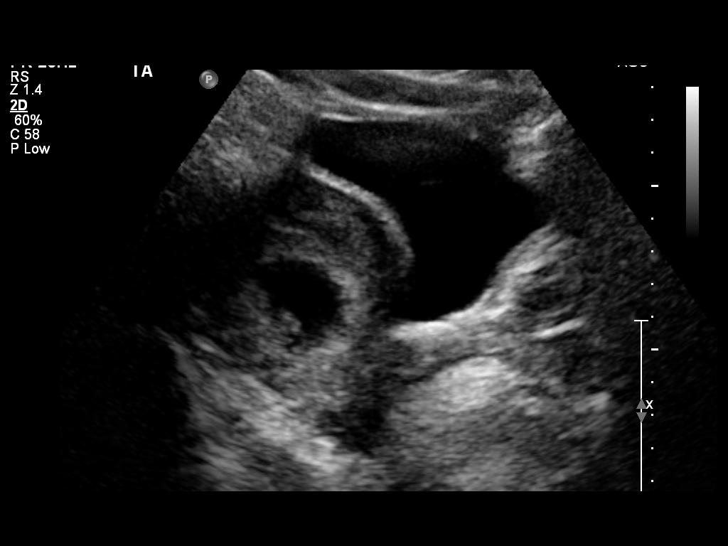
[im 5/39]
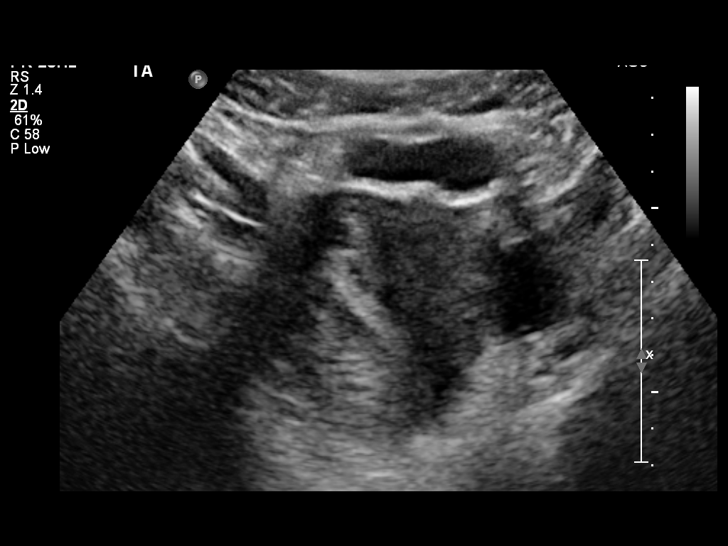
[im 8/39]
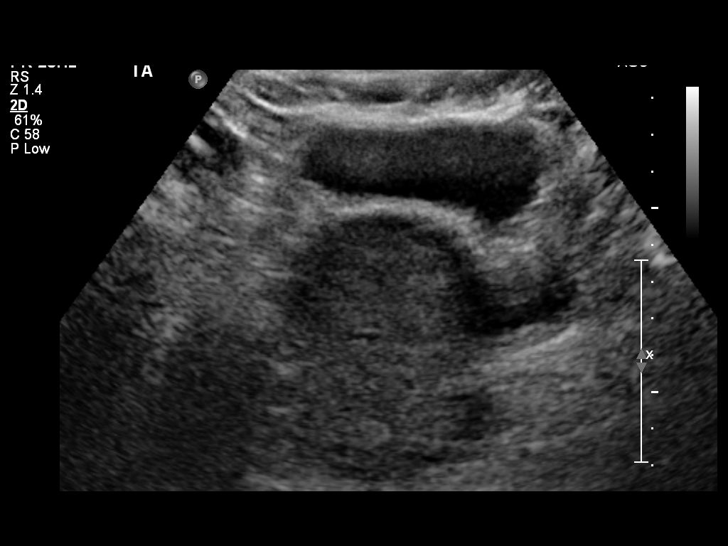
[im 10/39]
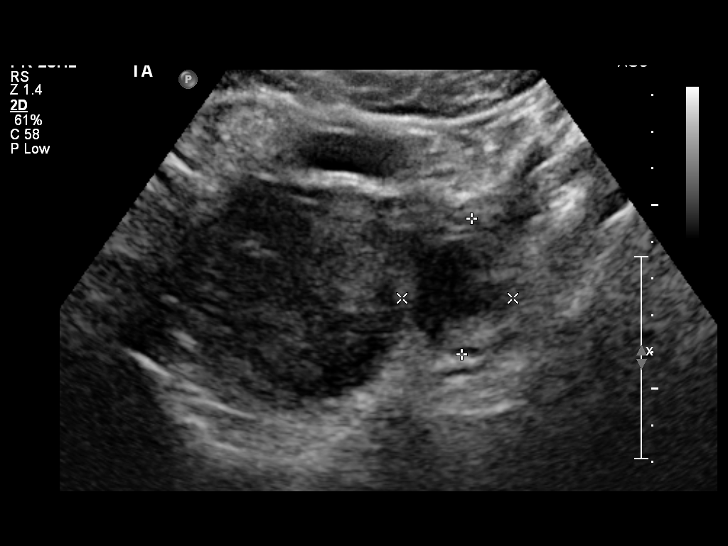
[im 13/39]
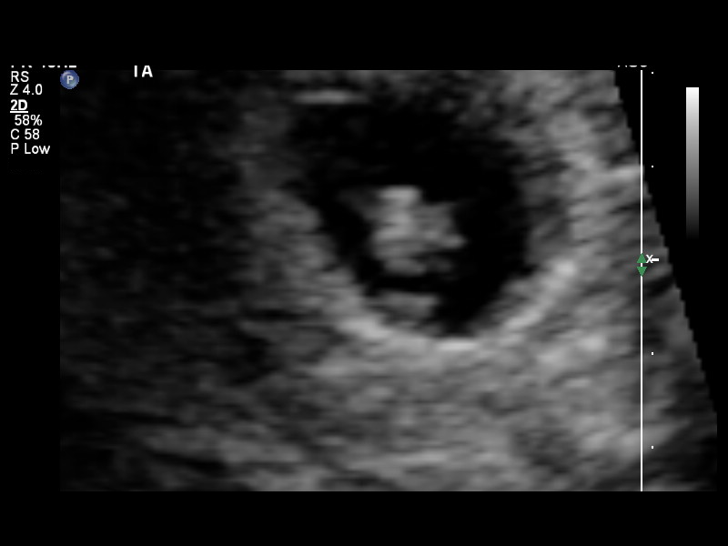
[im 16/39]
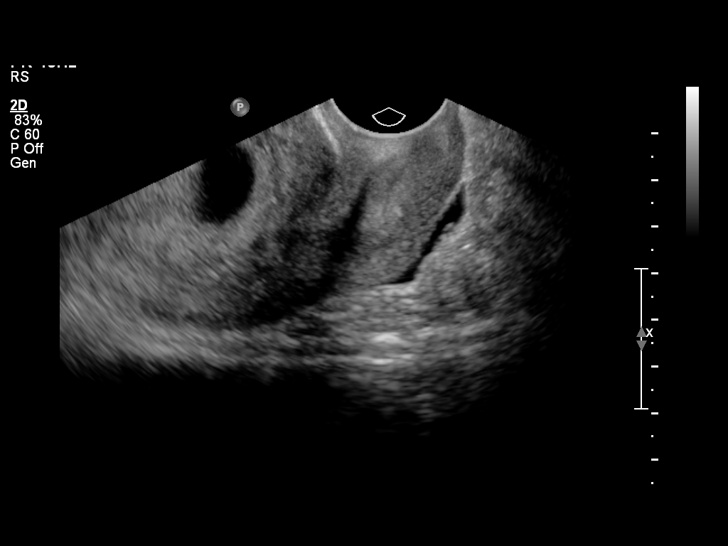
[im 19/39]
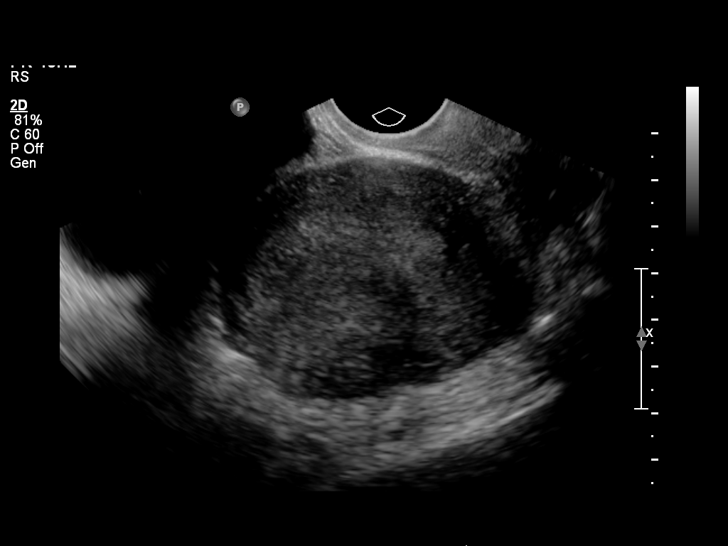
[im 22/39]
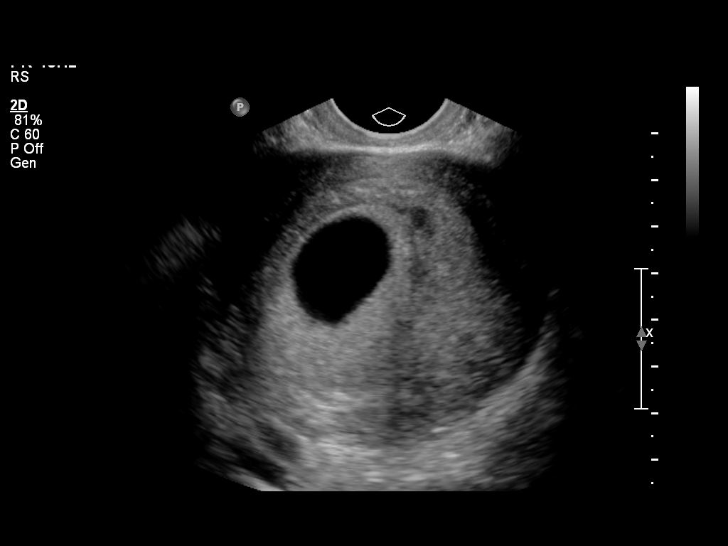
[im 24/39]
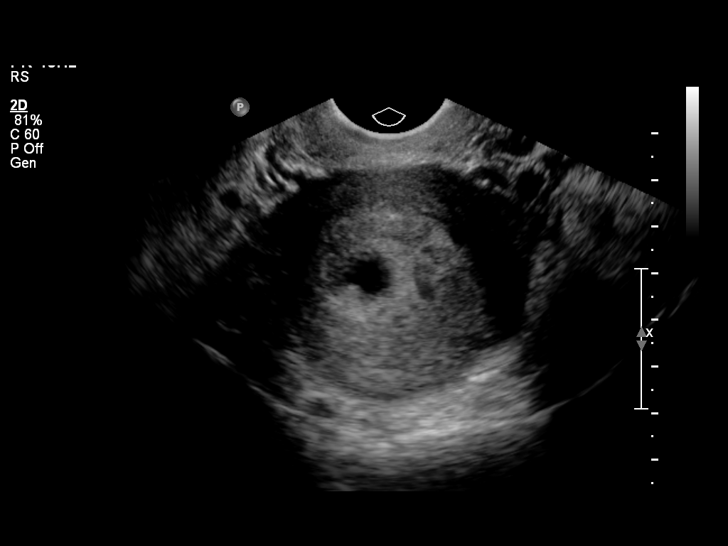
[im 27/39]
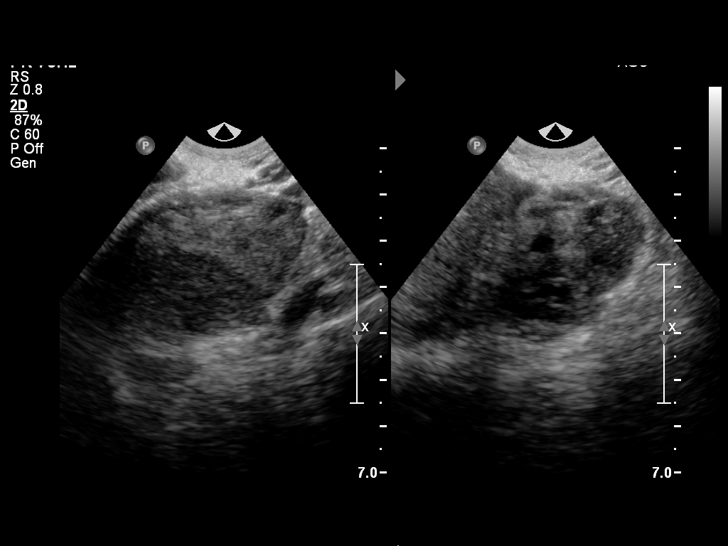
[im 30/39]
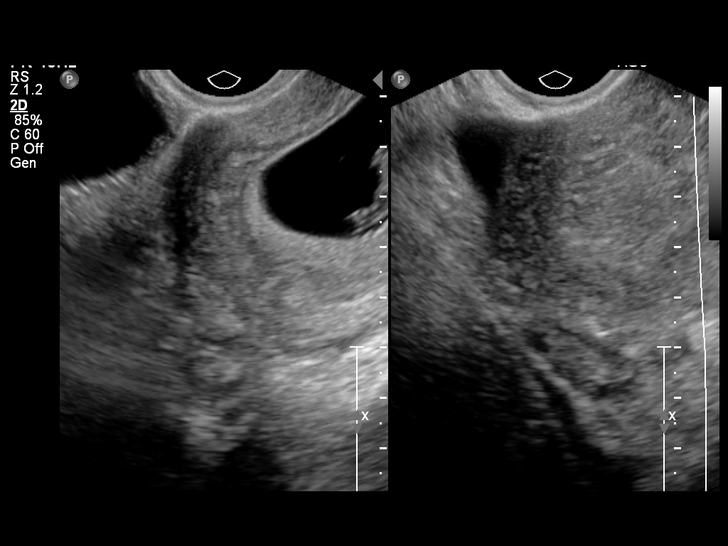
[im 33/39]
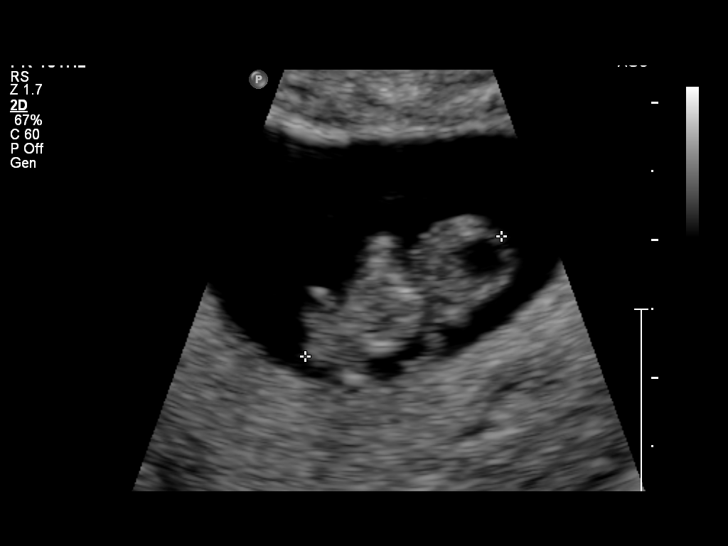
[im 36/39]
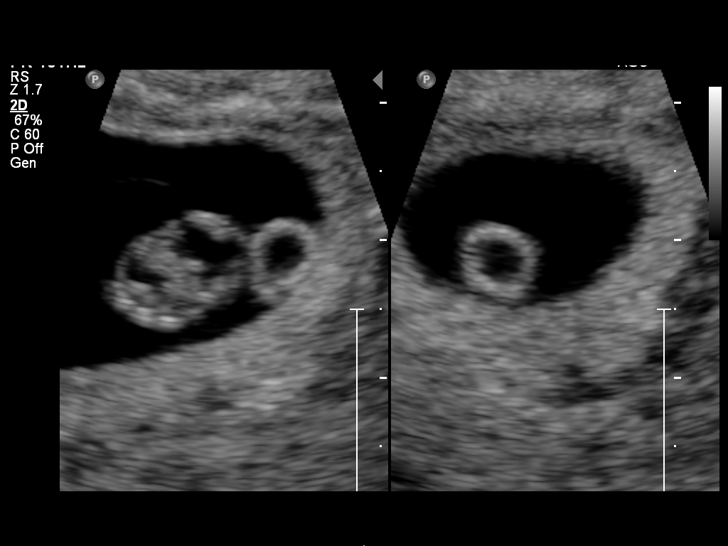
[im 39/39]
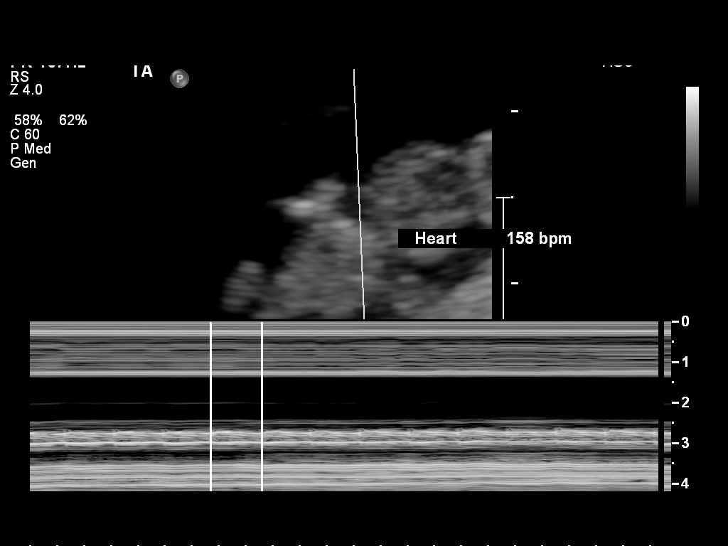

[14 of 28 positions shown; findings below may reference images not displayed]

FINDINGS: Intrauterine gestational sac: Visualized/normal in shape.

Yolk sac:  Visualized

Embryo:  Visualized

Cardiac Activity: Visualized

Heart Rate: 158  bpm

CRL:  18  mm   8 w   3 d                  US EDC: 02/18/2015

Maternal uterus/adnexae: Small subchorionic hemorrhage noted. Small
left ovarian corpus luteum also noted. Right ovary is normal in
appearance. No adnexal mass identified. Trace amount of free fluid
noted in cul-de-sac.
IMPRESSION: Single living IUP measuring 8 weeks 3 days with US EDC of
02/18/2015.

Small subchorionic hemorrhage noted.

## 2016-09-22 IMAGING — US US OB TRANSVAGINAL
1 series · 13 of 28 positions shown · non-contrast
Comparison: None.

CLINICAL DATA: Acute onset of right lower quadrant abdominal pain.
Initial encounter.

EXAM:
OBSTETRIC <14 WK US AND TRANSVAGINAL OB US
TECHNIQUE: Both transabdominal and transvaginal ultrasound examinations were
performed for complete evaluation of the gestation as well as the
maternal uterus, adnexal regions, and pelvic cul-de-sac.
Transvaginal technique was performed to assess early pregnancy.

[Series 1: us ob transvaginal · 36 acquisitions, 13 frames shown]
[im 2/36]
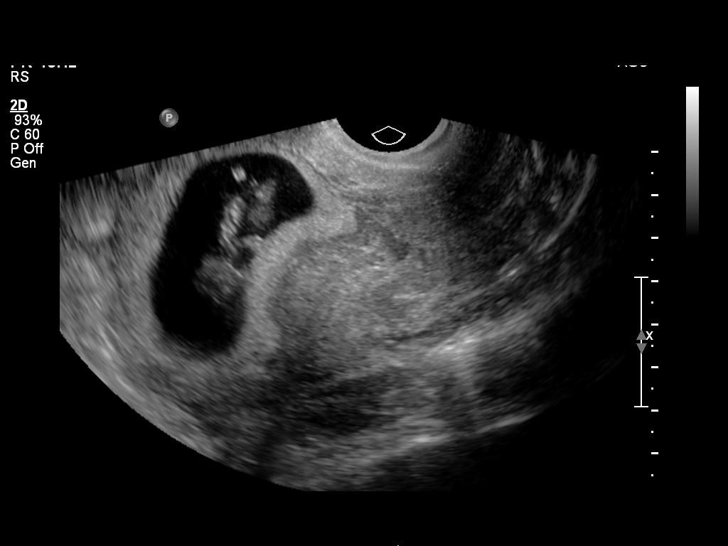
[im 4/36]
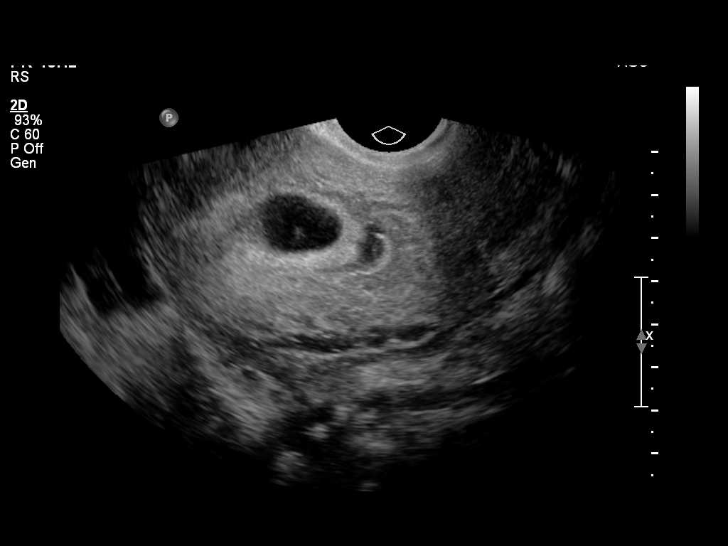
[im 7/36]
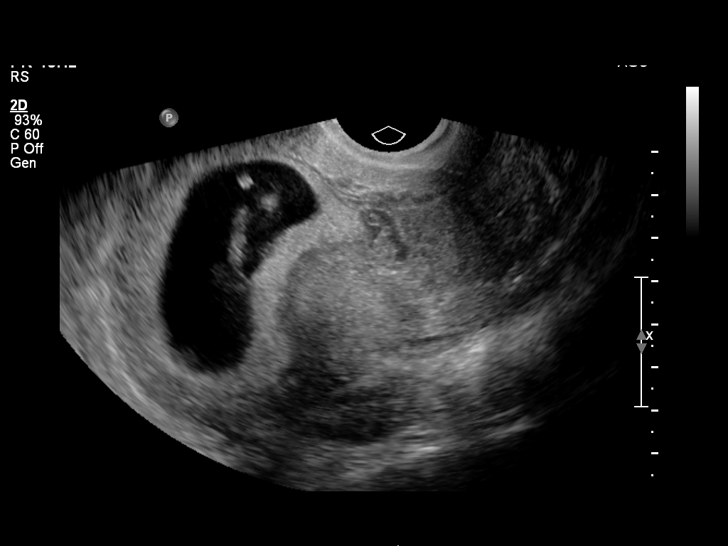
[im 10/36]
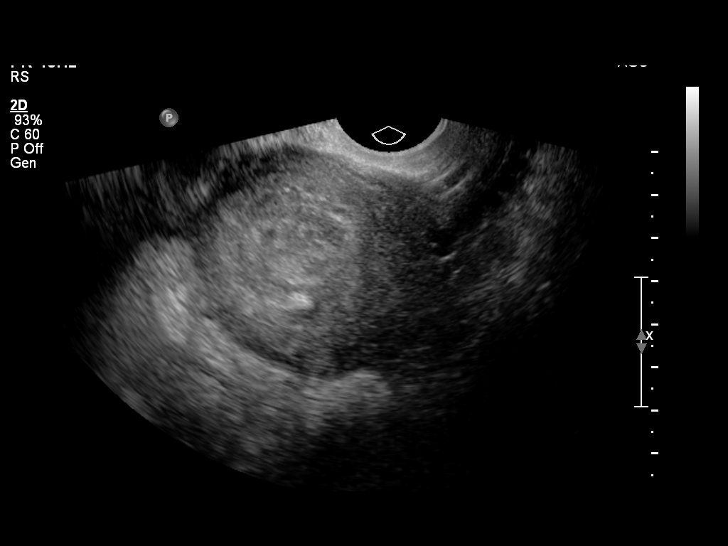
[im 12/36]
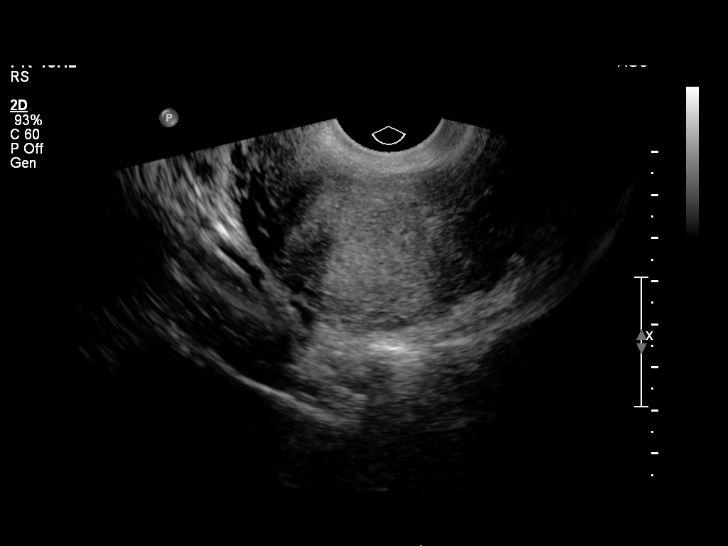
[im 15/36]
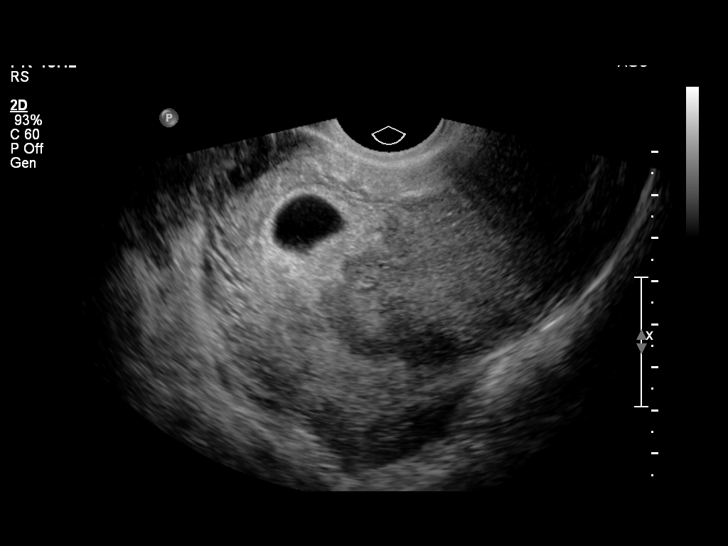
[im 19/36]
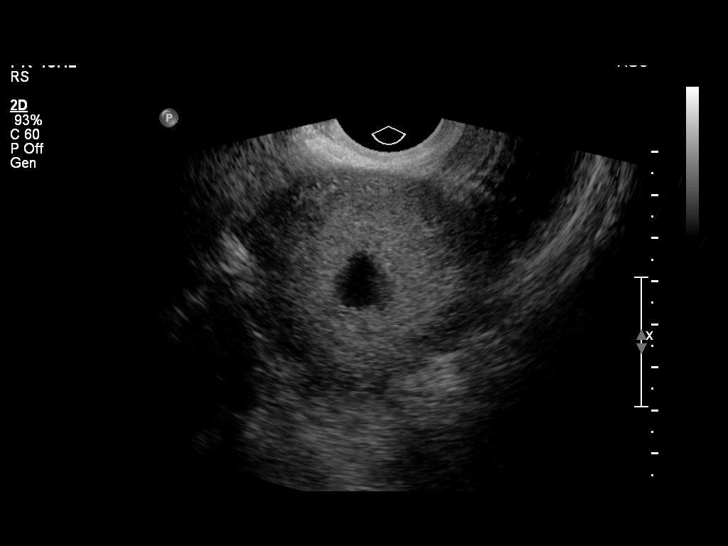
[im 21/36]
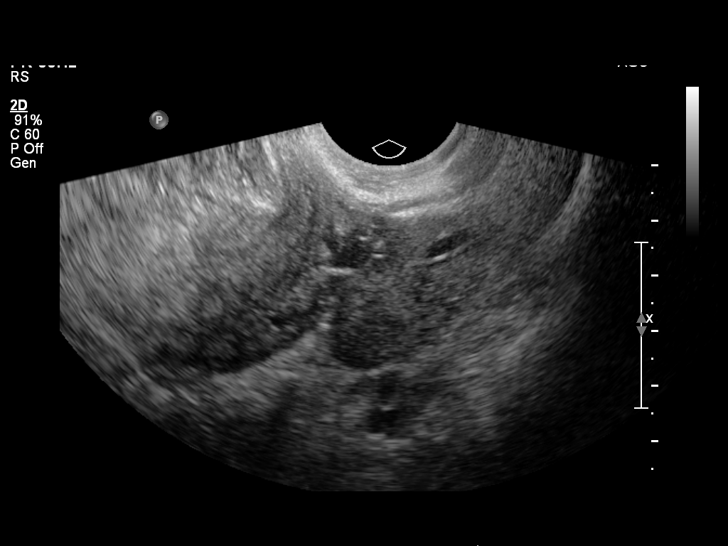
[im 24/36]
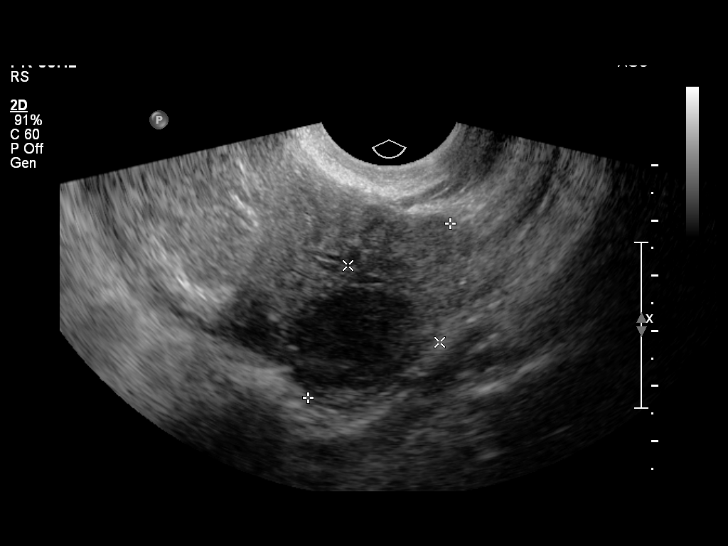
[im 26/36]
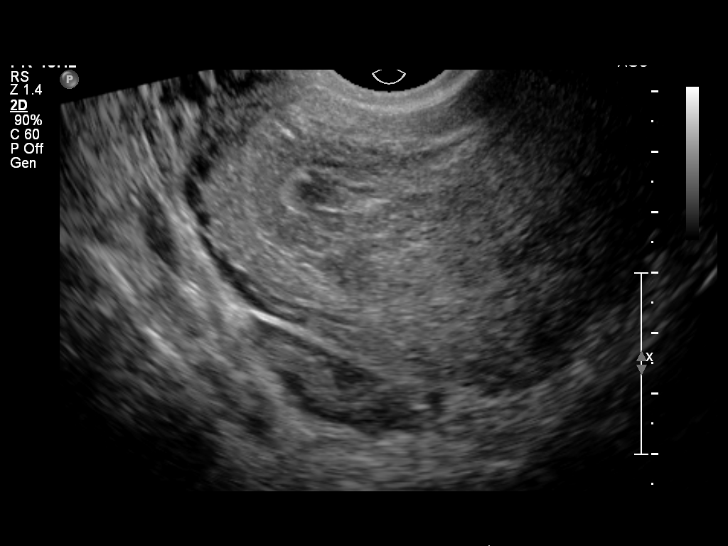
[im 29/36]
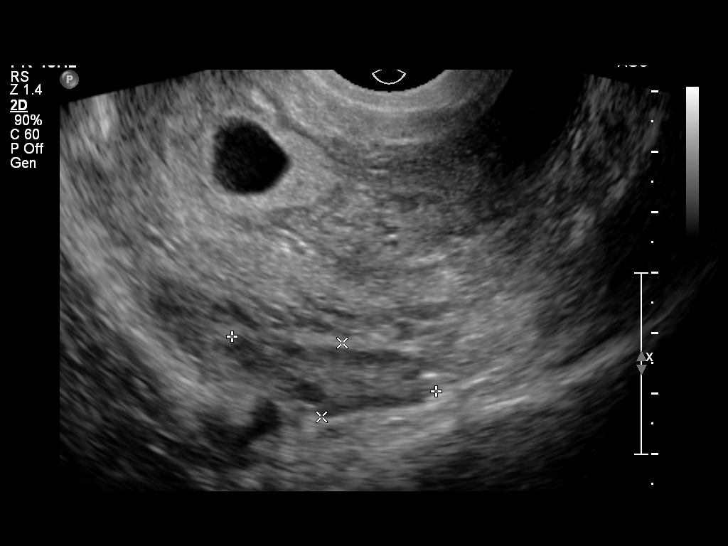
[im 32/36]
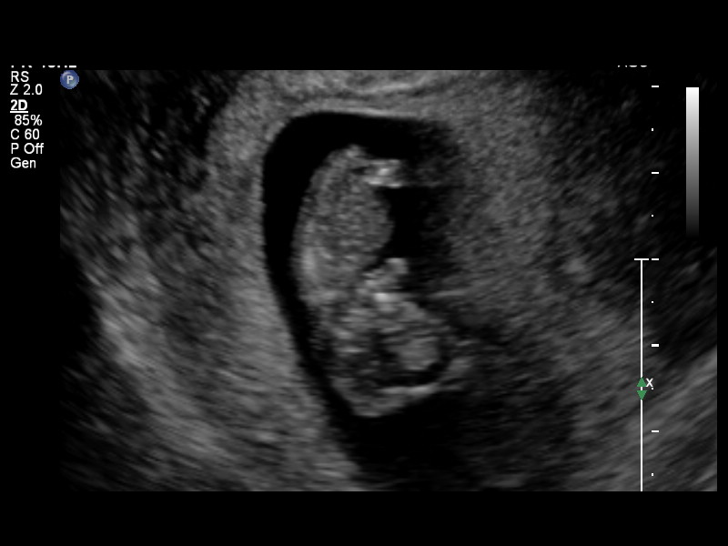
[im 34/36]
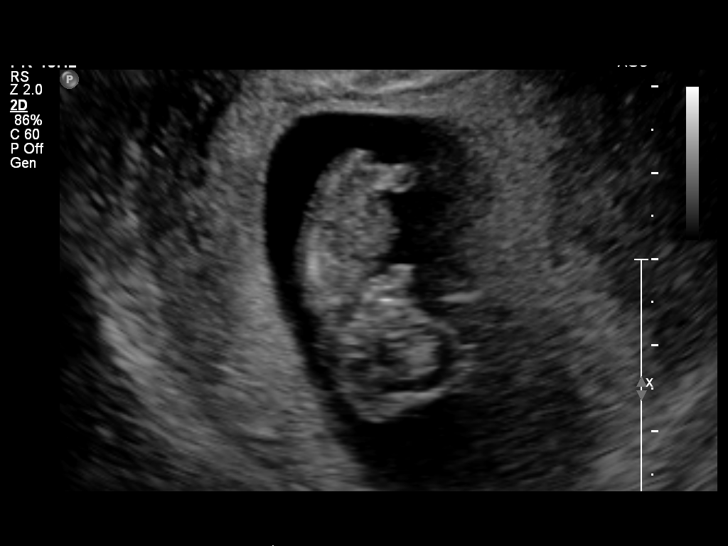

[13 of 28 positions shown; findings below may reference images not displayed]

FINDINGS: Intrauterine gestational sac: Visualized/normal in shape.

Yolk sac:  Yes

Embryo:  Yes

Cardiac Activity: Yes

Heart Rate: 167  bpm

CRL:  3.18 cm   10 w   1 d                  US EDC: 02/17/2015

Maternal uterus/adnexae: No subchorionic hemorrhage is noted. The
uterus is otherwise unremarkable.

The ovaries are within normal limits. The right ovary measures 3.5 x
1.3 x 1.3 cm, while the left ovary measures 4.1 x 2.2 x 1.9 cm. No
suspicious adnexal masses are seen; there is no evidence for ovarian
torsion.

No free fluid is seen within the pelvic cul-de-sac.
IMPRESSION: Single live intrauterine pregnancy noted, with a crown-rump length
of 3.2 cm, corresponding to a gestational age of 10 weeks 1 day.
This matches the gestational age of 9 weeks 2 days by LMP,
reflecting an estimated date of delivery February 23, 2015.

## 2017-03-19 IMAGING — US US MFM OB LIMITED
1 series · 14 of 14 positions shown · non-contrast
Comparison: none

[Series 1: us ob follow up · 14 acquisitions, 14 frames shown]
[im 1/14]
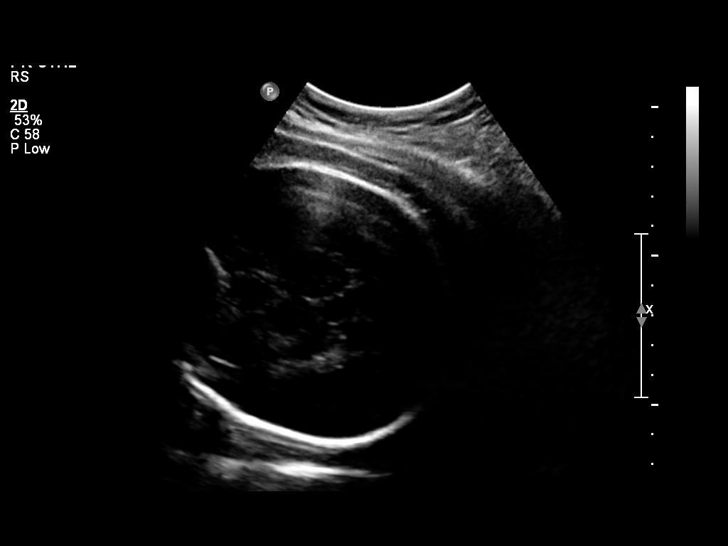
[im 2/14]
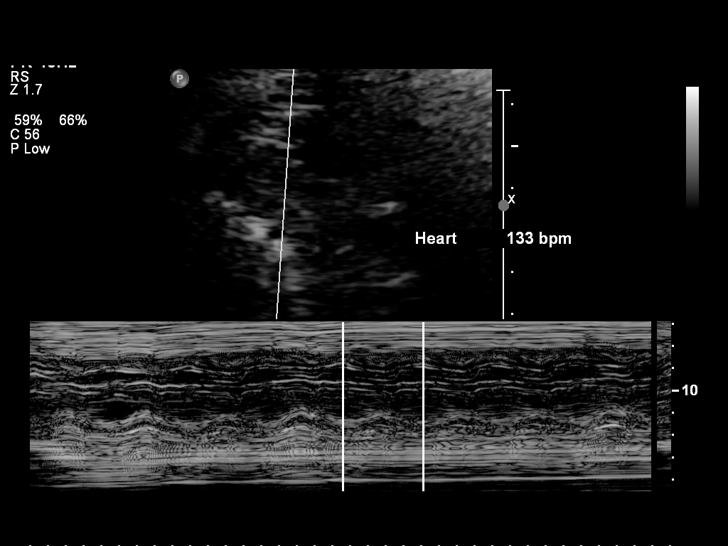
[im 3/14]
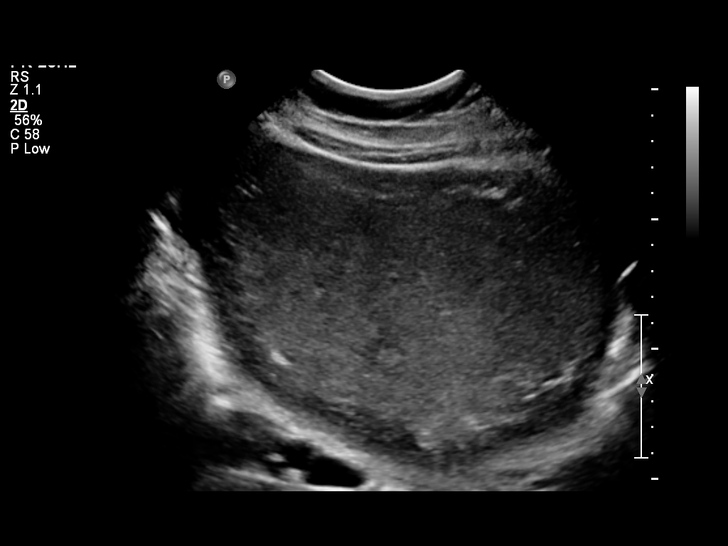
[im 4/14]
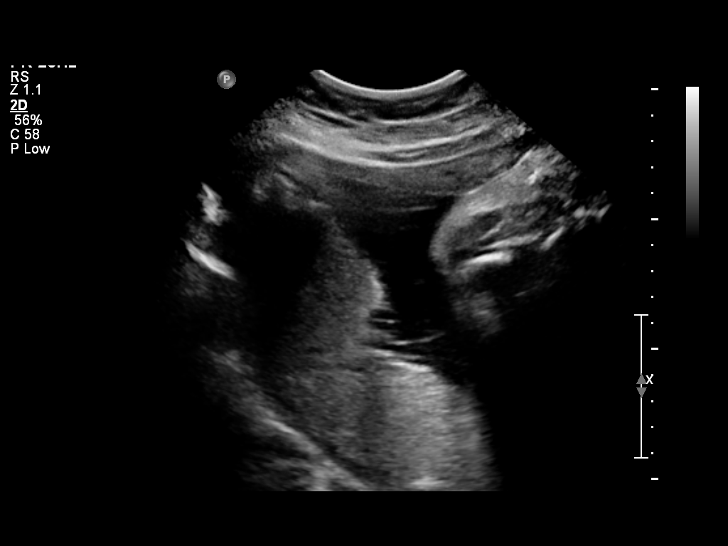
[im 5/14]
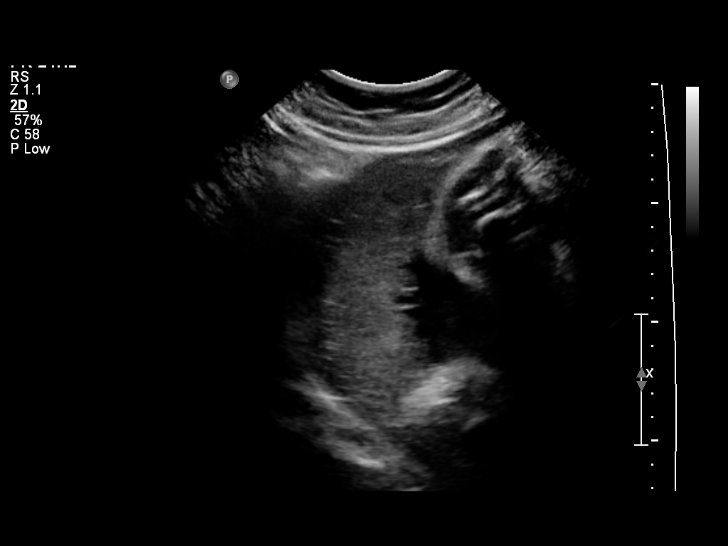
[im 6/14]
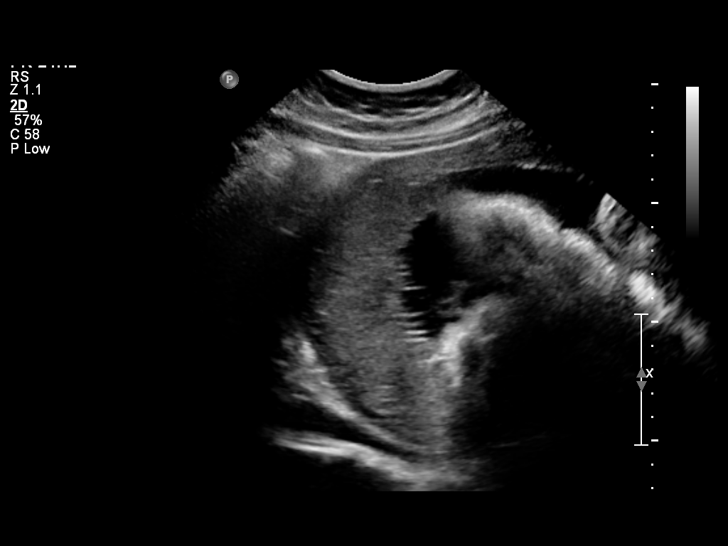
[im 7/14]
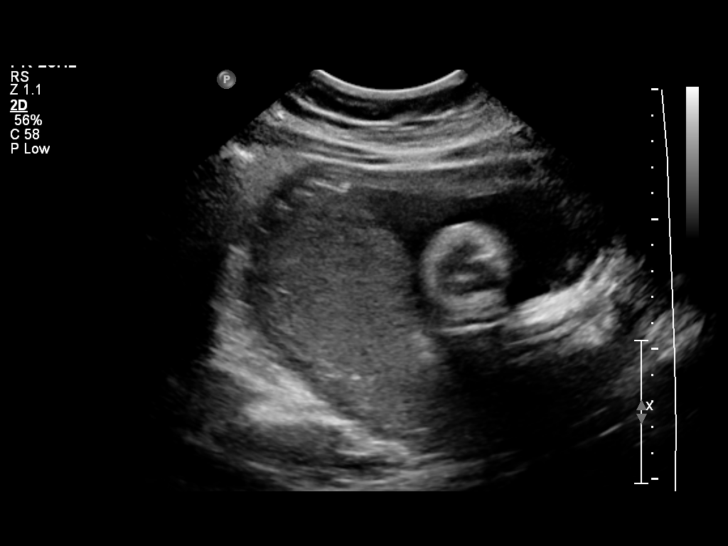
[im 8/14]
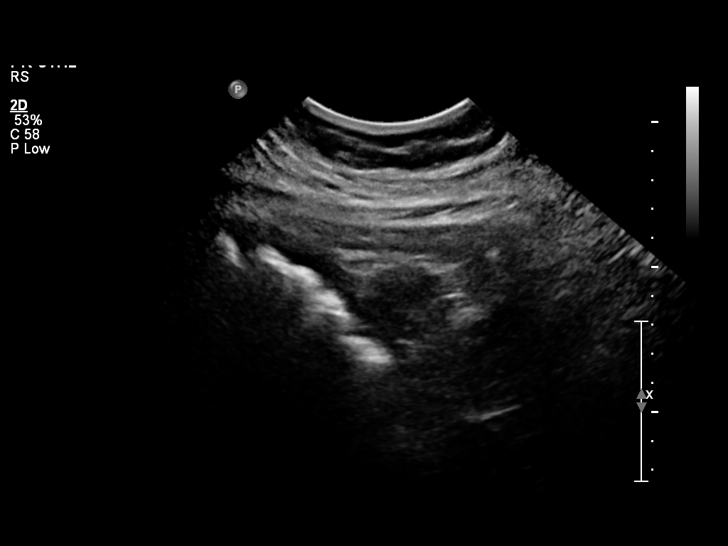
[im 9/14]
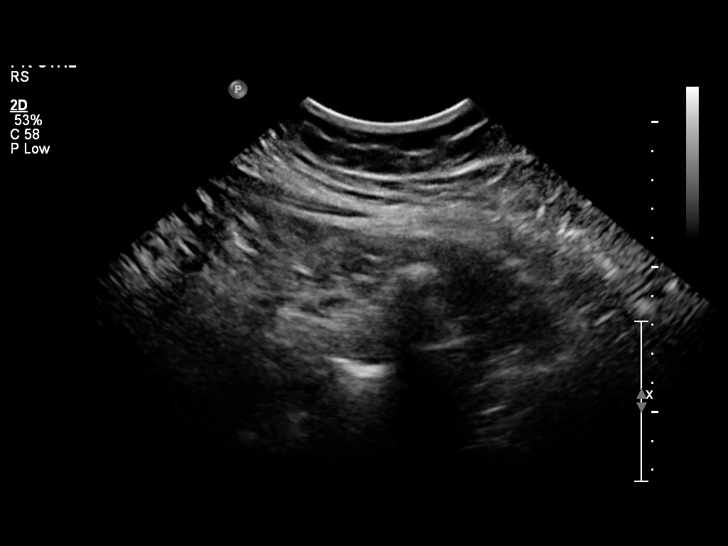
[im 10/14]
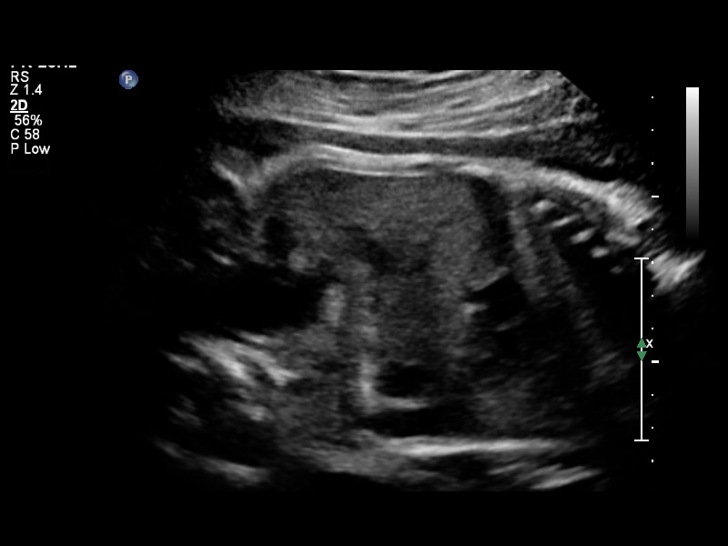
[im 11/14]
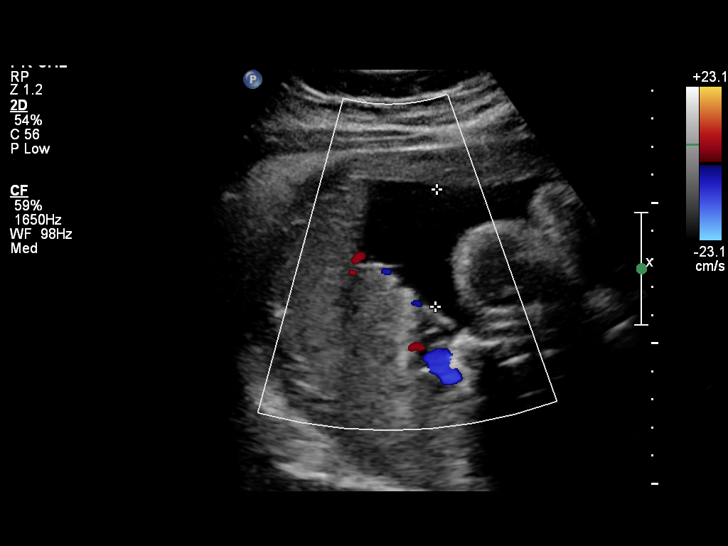
[im 12/14]
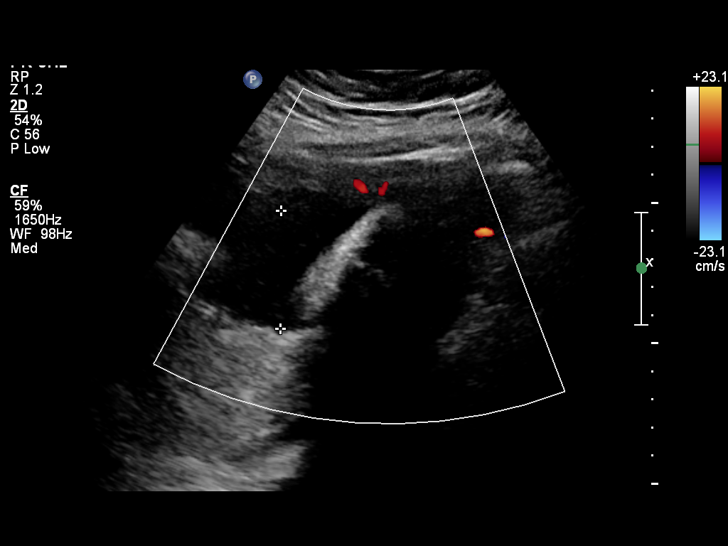
[im 13/14]
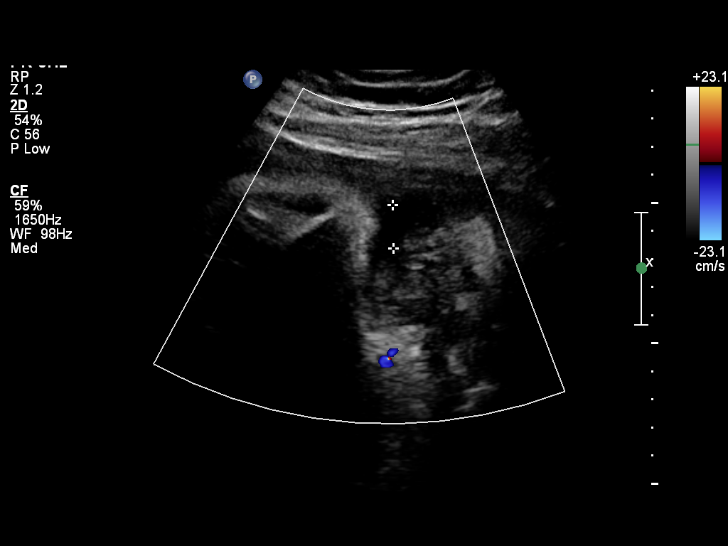
[im 14/14]
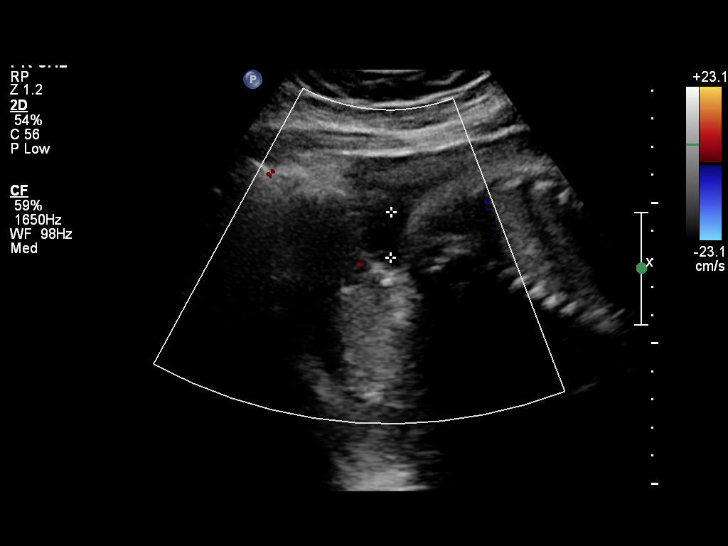

[14 of 14 positions shown; findings below may reference images not displayed]

OBSTETRICS REPORT
(Signed Final 01/19/2015 [DATE])

Service(s) Provided

US MFM OB LIMITED                                     76815.01
Indications

34 weeks gestation of pregnancy
Hypertension - Gestational (Currently taking no
meds)
Teen pregnancy
Fetal Evaluation

Num Of Fetuses:    1
Fetal Heart Rate:  133                          bpm
Cardiac Activity:  Observed
Presentation:      Cephalic
Placenta:          Posterior Fundal, above
cervical os
P. Cord            Previously Visualized
Insertion:

Amniotic Fluid
AFI FV:      Subjectively within normal limits
AFI Sum:     11.49    cm      31  %Tile      Larg Pckt:   4.18  cm
RUQ:   4.16    cm   RLQ:    4.18   cm    LUQ:    1.61   cm    LLQ:   1.54    cm
Gestational Age

LMP:           34w 5d        Date:  05/19/14                 EDD:    02/23/15
Best:          34w 5d     Det. By:  LMP  (05/19/14)          EDD:    02/23/15
Cervix Uterus Adnexa

Cervix:       Not visualized (advanced GA >42wks)

Adnexa:     No abnormality visualized.
Impression

SIUP at 34+5 weeks
Normal amniotic fluid volume

Recommendations

Continue twice weekly NSTs with weekly AFIs
questions or concerns.

## 2017-04-25 ENCOUNTER — Encounter: Payer: Self-pay | Admitting: Advanced Practice Midwife

## 2017-04-25 ENCOUNTER — Ambulatory Visit (INDEPENDENT_AMBULATORY_CARE_PROVIDER_SITE_OTHER): Payer: Self-pay | Admitting: Advanced Practice Midwife

## 2017-04-25 ENCOUNTER — Other Ambulatory Visit (HOSPITAL_COMMUNITY)
Admission: RE | Admit: 2017-04-25 | Discharge: 2017-04-25 | Disposition: A | Discharge status: Home / Self Care | Payer: Medicaid Other | Source: Ambulatory Visit | Attending: Advanced Practice Midwife | Admitting: Advanced Practice Midwife

## 2017-04-25 VITALS — BP 122/73 | HR 98 | Ht 63.0 in | Wt 243.5 lb

## 2017-04-25 DIAGNOSIS — Z0000 Encounter for general adult medical examination without abnormal findings: Secondary | ICD-10-CM | POA: Insufficient documentation

## 2017-04-25 DIAGNOSIS — N921 Excessive and frequent menstruation with irregular cycle: Secondary | ICD-10-CM

## 2017-04-25 DIAGNOSIS — Z23 Encounter for immunization: Secondary | ICD-10-CM

## 2017-04-25 DIAGNOSIS — Z978 Presence of other specified devices: Secondary | ICD-10-CM

## 2017-04-25 DIAGNOSIS — Z975 Presence of (intrauterine) contraceptive device: Secondary | ICD-10-CM

## 2017-04-25 DIAGNOSIS — N926 Irregular menstruation, unspecified: Secondary | ICD-10-CM

## 2017-04-25 MED ORDER — IBUPROFEN 800 MG PO TABS: 800.0000 mg | ORAL_TABLET | Freq: Three times a day (TID) | ORAL | 1 refills | Status: DC

## 2017-04-25 NOTE — Patient Instructions (Signed)

## 2017-04-25 NOTE — Progress Notes (Signed)
Subjective:     Patient ID: Erin Cardenas, female   DOB: 1996-03-18, 21 y.o.   MRN: [id number]  Erin Cardenas is a 21 y.o. G1P1001 who presents today with breakthrough bleeding. She had a Nexplanon placed about 2 years. She states that she did not have any problems with it until recently. For the first year she had no period at all, the second year she had "normal" periods. Now she is having more irregular bleeding. She states on 11/17 she had spotting for about one week, and then on 11/23 it started to become heavy. She had heavy bleeding until 11/27, and then she had a couple days of spotting. She started bleeding again on 12/2, light spotting, and is still spotting today.    Vaginal Bleeding  The patient's primary symptoms include pelvic pain and vaginal bleeding. This is a new problem. The current episode started yesterday. The problem occurs intermittently. The problem has been unchanged. Pain severity now: 4/10. The problem affects both sides. She is not pregnant. Pertinent negatives include no chills, dysuria, fever, frequency, nausea or vomiting. The vaginal discharge was bloody. The vaginal bleeding is spotting. She has not been passing clots. She has not been passing tissue. Nothing aggravates the symptoms. She has tried nothing for the symptoms. She is sexually active (not recently). Contraceptive use: Nexplanon  Her menstrual history has been irregular.     Review of Systems  Constitutional: Negative for chills and fever.  Gastrointestinal: Negative for nausea and vomiting.  Genitourinary: Positive for pelvic pain and vaginal bleeding. Negative for dysuria and frequency.       Objective:   Physical Exam  Constitutional: She is oriented to person, place, and time. She appears well-developed and well-nourished. No distress.  HENT:  Head: Normocephalic.  Cardiovascular: Normal rate.  Pulmonary/Chest: Effort normal.  Abdominal: Soft. There is no tenderness. There is no rebound.   Genitourinary:  Genitourinary Comments: External: no lesion Vagina: [place] amount of blood seen  Cervix: pink, smooth, no CMT Uterus: NSSC Adnexa: NT   Neurological: She is alert and oriented to person, place, and time.  Skin: Skin is warm and dry.  Psychiatric: She has a normal mood and affect.  Nursing note and vitals reviewed.  Assessment:   1. Annual physical exam   2. Irregular periods/menstrual cycles   3. Breakthrough bleeding on Nexplanon     Plan:   Ibuprofen 800mg  TID x 10 days If no improvement will do 3-6 month trial of OCPs Pap, GC/CT done today Flu vaccine given today  Routine health maintenance  Thressa ShellerHeather Stephan Nelis 3:52 PM 04/25/17

## 2017-04-26 LAB — CYTOLOGY - PAP
CHLAMYDIA, DNA PROBE: NEGATIVE
DIAGNOSIS: NEGATIVE
Neisseria Gonorrhea: NEGATIVE

## 2017-05-19 ENCOUNTER — Ambulatory Visit (HOSPITAL_COMMUNITY)
Admission: EM | Admit: 2017-05-19 | Discharge: 2017-05-19 | Disposition: A | Discharge status: Home / Self Care | Payer: Medicaid Other | Attending: Internal Medicine | Admitting: Internal Medicine

## 2017-05-19 ENCOUNTER — Encounter (HOSPITAL_COMMUNITY): Payer: Self-pay | Admitting: Family Medicine

## 2017-05-19 DIAGNOSIS — H00015 Hordeolum externum left lower eyelid: Secondary | ICD-10-CM

## 2017-05-19 MED ORDER — SULFAMETHOXAZOLE-TRIMETHOPRIM 800-160 MG PO TABS: 1.0000 | ORAL_TABLET | Freq: Two times a day (BID) | ORAL | 0 refills | Status: AC

## 2017-05-19 NOTE — ED Provider Notes (Signed)
[facility]    CSN: [id number] Arrival date & time: 05/19/17  1050     History   Chief Complaint Chief Complaint  Patient presents with  . Stye    HPI Erin Cardenas is a 21 y.o. female.   Erin Cardenas presents with complaints of left lower eye lid swelling and tearing which has been worsening over the past 4 days. States she has had a stye in the past to this same area but this is more severe. It is tender to the touch. Has tried an otc eye drop as well as warm compression which have not helped. Without vision changes or eye ball pain, without fevers or uri symptoms. Without mattering or discharge.   ROS per HPI.       Past Medical History:  Diagnosis Date  . Hypertension     Patient Active Problem List   Diagnosis Date Noted  . Term pregnancy 02/02/2015  . GBS (group B Streptococcus carrier), +RV culture, currently pregnant 01/24/2015  . Obesity affecting pregnancy in third trimester 01/07/2015  . Gestational hypertension w/o significant proteinuria in 3rd trimester 12/23/2014  . Supervision of normal pregnancy 08/09/2014  . Teen pregnancy 08/09/2014    Past Surgical History:  Procedure Laterality Date  . NO PAST SURGERIES    . SUBDERMAL ETONOGESTREL IMPLANT INSERTION  02/05/2015        OB History    Gravida Para Term Preterm AB Living   1 1 1  0 0 1   SAB TAB Ectopic Multiple Live Births   0 0 0 0 1       Home Medications    Prior to Admission medications   Medication Sig Start Date End Date Taking? Authorizing Provider  sulfamethoxazole-trimethoprim (BACTRIM DS,SEPTRA DS) 800-160 MG tablet Take 1 tablet by mouth 2 (two) times daily for 7 days. 05/19/17 05/26/17  Georgetta HaberBurky, Natalie B, NP    Family History Family History  Problem Relation Age of Onset  . Hypertension Mother   . Cancer Maternal Grandmother     Social History Social History   Tobacco Use  . Smoking status: Never Smoker  . Smokeless tobacco: Never Used  Substance Use Topics   . Alcohol use: No  . Drug use: No     Allergies   Patient has no known allergies.   Review of Systems Review of Systems   Physical Exam Triage Vital Signs ED Triage Vitals [05/19/17 1145]  Enc Vitals Group     BP 128/73     Pulse Rate 73     Resp 16     Temp 98.6 F (37 C)     Temp Source Oral     SpO2 97 %     Weight      Height      Head Circumference      Peak Flow      Pain Score      Pain Loc      Pain Edu?      Excl. in GC?    No data found.  Updated Vital Signs BP 128/73 (BP Location: Left Arm)   Pulse 73   Temp 98.6 F (37 C) (Oral)   Resp 16   SpO2 97%   Visual Acuity Right Eye Distance:   Left Eye Distance:   Bilateral Distance:    Right Eye Near:   Left Eye Near:    Bilateral Near:     Physical Exam  Constitutional: She is oriented to person, place,  and time. She appears well-developed and well-nourished. No distress.  Eyes: Conjunctivae and EOM are normal. Pupils are equal, round, and reactive to light. Left eye exhibits hordeolum.  Left lower lid with generalized swelling and hordeolum noted at lash line   Cardiovascular: Normal rate, regular rhythm and normal heart sounds.  Pulmonary/Chest: Effort normal and breath sounds normal.  Neurological: She is alert and oriented to person, place, and time.  Skin: Skin is warm and dry.     UC Treatments / Results  Labs (all labs ordered are listed, but only abnormal results are displayed) Labs Reviewed - No data to display  EKG  EKG Interpretation None       Radiology No results found.  Procedures Procedures (including critical care time)  Medications Ordered in UC Medications - No data to display   Initial Impression / Assessment and Plan / UC Course  I have reviewed the triage vital signs and the nursing notes.  Pertinent labs & imaging results that were available during my care of the patient were reviewed by me and considered in my medical decision making (see chart for  details).     Worsening of symptoms despite conservative treatment at home, opted to try course of bactrim x1 week. Follow up with ophthalmology if symptoms do not improve with course. Warm compresses to continued. Patient verbalized understanding and agreeable to plan.    Final Clinical Impressions(s) / UC Diagnoses   Final diagnoses:  Hordeolum externum left lower eyelid    ED Discharge Orders        Ordered    sulfamethoxazole-trimethoprim (BACTRIM DS,SEPTRA DS) 800-160 MG tablet  2 times daily     05/19/17 1246       Controlled Substance Prescriptions [place] Controlled Substance Registry consulted? Not Applicable   Georgetta HaberBurky, Natalie B, NP 05/19/17 1251

## 2017-05-19 NOTE — Discharge Instructions (Signed)
Warm compresses up 2-4 times a day. Complete course of antibiotics. If worsening of symptoms or no improvement at completion of antibiotics please follow up with ophthalmology

## 2017-05-19 NOTE — ED Triage Notes (Signed)
Pt here for left eyelid swelling and stye x 4 days. Using warm compresses.

## 2017-06-28 ENCOUNTER — Encounter: Payer: Self-pay | Admitting: *Deleted

## 2017-07-21 ENCOUNTER — Encounter: Payer: Self-pay | Admitting: *Deleted

## 2018-03-22 ENCOUNTER — Encounter: Payer: Self-pay | Admitting: Family Medicine

## 2018-03-22 ENCOUNTER — Ambulatory Visit (INDEPENDENT_AMBULATORY_CARE_PROVIDER_SITE_OTHER): Payer: Medicaid Other | Admitting: Family Medicine

## 2018-03-22 VITALS — BP 128/65 | HR 90 | Ht 63.5 in | Wt 243.5 lb

## 2018-03-22 DIAGNOSIS — Z3046 Encounter for surveillance of implantable subdermal contraceptive: Secondary | ICD-10-CM

## 2018-03-22 DIAGNOSIS — Z30017 Encounter for initial prescription of implantable subdermal contraceptive: Secondary | ICD-10-CM

## 2018-03-22 MED ORDER — ETONOGESTREL 68 MG ~~LOC~~ IMPL
68.0000 mg | DRUG_IMPLANT | Freq: Once | SUBCUTANEOUS | Status: AC
  Administered 2018-03-22: 68 mg via SUBCUTANEOUS

## 2018-03-22 NOTE — Progress Notes (Signed)
   GYNECOLOGY OFFICE VISIT NOTE History:  22 y.o. G1P1001 here today for Nexplanon removal/insertion. Has had last NExplanon for 3 years. Some irregular bleeding but overall really likes it.   The following portions of the patient's history were reviewed and updated as appropriate: allergies, current medications, past family history, past medical history, past social history, past surgical history and problem list.   Health Maintenance:  Normal pap 04/2017  Review of Systems:  Pertinent items noted in HPI ROS  Objective:  Physical Exam BP 128/65   Pulse 90   Ht 5' 3.5" (1.613 m)   Wt 243 lb 8 oz (110.5 kg)   BMI 42.46 kg/m  Physical Exam  Constitutional: She is oriented to person, place, and time. She appears well-developed and well-nourished. No distress.  HENT:  Head: Normocephalic and atraumatic.  Musculoskeletal:  Nexplanon palpated in left upper arm   Neurological: She is alert and oriented to person, place, and time.  Psychiatric: She has a normal mood and affect. Her behavior is normal.  Nursing note and vitals reviewed.  Labs and Imaging No results found for this or any previous visit (from the past 168 hour(s)). No results found.  Assessment & Plan:   Nexplanon Removal and Insertion  Patient identified, informed consent performed, consent signed.   Patient does understand that irregular bleeding is a very common side effect of this medication.  Appropriate time out taken. Implanon site identified. Area prepped in usual sterile fashon. One ml of 1% lidocaine was used to anesthetize the area at the distal end of the implant. A Pavey stab incision was made right beside the implant on the distal portion. The Nexplanon rod was grasped using hemostats and removed without difficulty. There was minimal blood loss. There were no complications. Area was then injected with 3 ml of 1 % lidocaine. Nexplanon removed from packaging, Device confirmed in needle, then inserted full  length of needle and withdrawn per handbook instructions. Nexplanon was able to palpated in the patient's arm; patient palpated the insert herself.  There was minimal blood loss. Patient insertion site covered with guaze and a pressure bandage to reduce any bruising. The patient tolerated the procedure well and was given post procedure instructions.  She was advised to have backup contraception for one week.    Cristal Deer. Earlene Plater, DO OB Family Medicine Fellow, [facility] for [employer], [facility]

## 2018-03-22 NOTE — Patient Instructions (Signed)
Etonogestrel implant What is this medicine? ETONOGESTREL (et oh noe JES trel) is a contraceptive (birth control) device. It is used to prevent pregnancy. It can be used for up to 3 years. This medicine may be used for other purposes; ask your health care provider or pharmacist if you have questions. COMMON BRAND NAME(S): Implanon, Nexplanon What should I tell my health care provider before I take this medicine? They need to know if you have any of these conditions: -abnormal vaginal bleeding -blood vessel disease or blood clots -cancer of the breast, cervix, or liver -depression -diabetes -gallbladder disease -headaches -heart disease or recent heart attack -high blood pressure -high cholesterol -kidney disease -liver disease -renal disease -seizures -tobacco smoker -an unusual or allergic reaction to etonogestrel, other hormones, anesthetics or antiseptics, medicines, foods, dyes, or preservatives -pregnant or trying to get pregnant -breast-feeding How should I use this medicine? This device is inserted just under the skin on the inner side of your upper arm by a health care professional. Talk to your pediatrician regarding the use of this medicine in children. Special care may be needed. Overdosage: If you think you have taken too much of this medicine contact a poison control center or emergency room at once. NOTE: This medicine is only for you. Do not share this medicine with others. What if I miss a dose? This does not apply. What may interact with this medicine? Do not take this medicine with any of the following medications: -amprenavir -bosentan -fosamprenavir This medicine may also interact with the following medications: -barbiturate medicines for inducing sleep or treating seizures -certain medicines for fungal infections like ketoconazole and itraconazole -grapefruit juice -griseofulvin -medicines to treat seizures like carbamazepine, felbamate, oxcarbazepine,  phenytoin, topiramate -modafinil -phenylbutazone -rifampin -rufinamide -some medicines to treat HIV infection like atazanavir, indinavir, lopinavir, nelfinavir, tipranavir, ritonavir -St. John's wort This list may not describe all possible interactions. Give your health care provider a list of all the medicines, herbs, non-prescription drugs, or dietary supplements you use. Also tell them if you smoke, drink alcohol, or use illegal drugs. Some items may interact with your medicine. What should I watch for while using this medicine? This product does not protect you against HIV infection (AIDS) or other sexually transmitted diseases. You should be able to feel the implant by pressing your fingertips over the skin where it was inserted. Contact your doctor if you cannot feel the implant, and use a non-hormonal birth control method (such as condoms) until your doctor confirms that the implant is in place. If you feel that the implant may have broken or become bent while in your arm, contact your healthcare provider. What side effects may I notice from receiving this medicine? Side effects that you should report to your doctor or health care professional as soon as possible: -allergic reactions like skin rash, itching or hives, swelling of the face, lips, or tongue -breast lumps -changes in emotions or moods -depressed mood -heavy or prolonged menstrual bleeding -pain, irritation, swelling, or bruising at the insertion site -scar at site of insertion -signs of infection at the insertion site such as fever, and skin redness, pain or discharge -signs of pregnancy -signs and symptoms of a blood clot such as breathing problems; changes in vision; chest pain; severe, sudden headache; pain, swelling, warmth in the leg; trouble speaking; sudden numbness or weakness of the face, arm or leg -signs and symptoms of liver injury like dark yellow or brown urine; general ill feeling or flu-like symptoms;  light-colored   stools; loss of appetite; nausea; right upper belly pain; unusually weak or tired; yellowing of the eyes or skin -unusual vaginal bleeding, discharge -signs and symptoms of a stroke like changes in vision; confusion; trouble speaking or understanding; severe headaches; sudden numbness or weakness of the face, arm or leg; trouble walking; dizziness; loss of balance or coordination Side effects that usually do not require medical attention (report to your doctor or health care professional if they continue or are bothersome): -acne -back pain -breast pain -changes in weight -dizziness -general ill feeling or flu-like symptoms -headache -irregular menstrual bleeding -nausea -sore throat -vaginal irritation or inflammation This list may not describe all possible side effects. Call your doctor for medical advice about side effects. You may report side effects to FDA at 1-800-FDA-1088. Where should I keep my medicine? This drug is given in a hospital or clinic and will not be stored at home. NOTE: This sheet is a summary. It may not cover all possible information. If you have questions about this medicine, talk to your doctor, pharmacist, or health care provider.  2018 Elsevier/Gold Standard (2015-11-27 11:19:22)  

## 2018-12-06 ENCOUNTER — Encounter (HOSPITAL_COMMUNITY): Payer: Self-pay | Admitting: Emergency Medicine

## 2018-12-06 ENCOUNTER — Ambulatory Visit (HOSPITAL_COMMUNITY)
Admission: EM | Admit: 2018-12-06 | Discharge: 2018-12-06 | Disposition: A | Discharge status: Home / Self Care | Payer: Medicaid Other | Attending: Urgent Care | Admitting: Urgent Care

## 2018-12-06 ENCOUNTER — Other Ambulatory Visit: Payer: Self-pay

## 2018-12-06 DIAGNOSIS — Z20828 Contact with and (suspected) exposure to other viral communicable diseases: Secondary | ICD-10-CM | POA: Insufficient documentation

## 2018-12-06 DIAGNOSIS — R5381 Other malaise: Secondary | ICD-10-CM | POA: Insufficient documentation

## 2018-12-06 DIAGNOSIS — Z20822 Contact with and (suspected) exposure to covid-19: Secondary | ICD-10-CM

## 2018-12-06 DIAGNOSIS — R070 Pain in throat: Secondary | ICD-10-CM | POA: Insufficient documentation

## 2018-12-06 LAB — POCT RAPID STREP A: Streptococcus, Group A Screen (Direct): NEGATIVE

## 2018-12-06 NOTE — ED Provider Notes (Signed)
MRN: [id number] DOB: 06/18/1995  Subjective:   Erin Cardenas is a 23 y.o. female presenting for 2-day history of acute onset moderate to severe throat pain with difficulty swallowing and swelling of her tonsils with white spots.  Patient reports that her throat pain is improved but still feels a little sore and now has tenderness of the sides of her neck.  She has felt general malaise.  Has not tried medications for relief.  Patient is concerned because she does work at [employer].  Denies taking any chronic medications.  No Known Allergies  Past Medical History:  Diagnosis Date  . Hypertension      Past Surgical History:  Procedure Laterality Date  . NO PAST SURGERIES    . SUBDERMAL ETONOGESTREL IMPLANT INSERTION  02/05/2015        Review of Systems  Constitutional: Positive for malaise/fatigue. Negative for fever.  HENT: Positive for sore throat. Negative for congestion, ear pain and sinus pain.   Eyes: Negative for blurred vision, double vision, discharge and redness.  Respiratory: Negative for cough, hemoptysis, shortness of breath and wheezing.   Cardiovascular: Negative for chest pain.  Gastrointestinal: Negative for abdominal pain, diarrhea, nausea and vomiting.  Genitourinary: Negative for dysuria, flank pain and hematuria.  Musculoskeletal: Negative for myalgias.  Skin: Negative for rash.  Neurological: Negative for dizziness, weakness and headaches.  Psychiatric/Behavioral: Negative for depression and substance abuse.    Objective:   Vitals: BP (!) 123/55 (BP Location: Left Arm) Comment (BP Location): large cuff  Pulse 84   Temp 97.9 F (36.6 C)   Resp 18   Physical Exam Constitutional:      General: She is not in acute distress.    Appearance: She is well-developed. She is obese. She is not ill-appearing.  HENT:     Head: Normocephalic and atraumatic.     Right Ear: Tympanic membrane and ear canal normal. No drainage or tenderness. No middle ear effusion.  Tympanic membrane is not erythematous.     Left Ear: Tympanic membrane and ear canal normal. No drainage or tenderness.  No middle ear effusion. Tympanic membrane is not erythematous.     Nose: No congestion or rhinorrhea.     Mouth/Throat:     Mouth: Mucous membranes are moist. No oral lesions.     Pharynx: Pharyngeal swelling (Bilaterally) present. No oropharyngeal exudate, posterior oropharyngeal erythema or uvula swelling.     Tonsils: No tonsillar exudate or tonsillar abscesses.  Eyes:     Extraocular Movements:     Right eye: Normal extraocular motion.     Left eye: Normal extraocular motion.     Conjunctiva/sclera: Conjunctivae normal.     Pupils: Pupils are equal, round, and reactive to light.  Neck:     Musculoskeletal: Normal range of motion and neck supple.  Cardiovascular:     Rate and Rhythm: Normal rate.  Pulmonary:     Effort: Pulmonary effort is normal.  Lymphadenopathy:     Cervical: No cervical adenopathy.  Skin:    General: Skin is warm and dry.  Neurological:     General: No focal deficit present.     Mental Status: She is alert and oriented to person, place, and time.  Psychiatric:        Mood and Affect: Mood normal.        Behavior: Behavior normal.     Negative rapid strep by my personal reading.   Assessment and Plan :   1. Throat pain  2. Malaise   3. Exposure to Covid-19 Virus    Likely viral in etiology. Counseled patient on nature of COVID-19 including modes of transmission, diagnostic testing, management and supportive care.  Offered symptomatic relief. COVID 19 testing is pending. Counseled patient on potential for adverse effects with medications prescribed/recommended today, ER and return-to-clinic precautions discussed, patient verbalized understanding.      Jaynee Eagles, PA-C 12/06/18 1406

## 2018-12-06 NOTE — Discharge Instructions (Addendum)

## 2018-12-06 NOTE — ED Triage Notes (Signed)
Sore throat started 2 days ago, felt better yesterday, today, neck feels swollen.  denies fever, denies cough

## 2018-12-07 ENCOUNTER — Other Ambulatory Visit: Payer: Self-pay

## 2018-12-07 ENCOUNTER — Encounter (HOSPITAL_COMMUNITY): Payer: Self-pay | Admitting: *Deleted

## 2018-12-07 ENCOUNTER — Emergency Department (HOSPITAL_COMMUNITY)
Admission: EM | Admit: 2018-12-07 | Discharge: 2018-12-07 | Disposition: A | Discharge status: Home / Self Care | Payer: Medicaid Other | Attending: Emergency Medicine | Admitting: Emergency Medicine

## 2018-12-07 DIAGNOSIS — J029 Acute pharyngitis, unspecified: Secondary | ICD-10-CM | POA: Insufficient documentation

## 2018-12-07 DIAGNOSIS — B9789 Other viral agents as the cause of diseases classified elsewhere: Secondary | ICD-10-CM | POA: Insufficient documentation

## 2018-12-07 LAB — GROUP A STREP BY PCR: Group A Strep by PCR: NOT DETECTED

## 2018-12-07 LAB — NOVEL CORONAVIRUS, NAA (HOSP ORDER, SEND-OUT TO REF LAB; TAT 18-24 HRS): SARS-CoV-2, NAA: NOT DETECTED

## 2018-12-07 MED ORDER — DEXAMETHASONE 4 MG PO TABS
10.0000 mg | ORAL_TABLET | Freq: Once | ORAL | Status: AC
  Administered 2018-12-07: 10 mg via ORAL
  Filled 2018-12-07: qty 3

## 2018-12-07 MED ORDER — IBUPROFEN 400 MG PO TABS
600.0000 mg | ORAL_TABLET | Freq: Once | ORAL | Status: AC
  Administered 2018-12-07: 600 mg via ORAL
  Filled 2018-12-07: qty 1

## 2018-12-07 MED ORDER — OXYCODONE-ACETAMINOPHEN 5-325 MG PO TABS: 1.0000 | ORAL_TABLET | Freq: Four times a day (QID) | ORAL | 0 refills | Status: AC | PRN

## 2018-12-07 NOTE — ED Triage Notes (Signed)
Pt reports sore throat and swollen tonsils. Denies fevers, cough congestion

## 2018-12-07 NOTE — Discharge Instructions (Addendum)
Please read attached information. If you experience any new or worsening signs or symptoms please return to the emergency room for evaluation. Please follow-up with your primary care provider or specialist as discussed. Please use medication prescribed only as directed and discontinue taking if you have any concerning signs or symptoms.   °

## 2018-12-07 NOTE — ED Provider Notes (Signed)
[facility] Provider Note   CSN: [id number] Arrival date & time: 12/07/18  1037    History   Chief Complaint Chief Complaint  Patient presents with  . Sore Throat    HPI Erin Cardenas is a 23 y.o. female.     HPI   23 year old female presents today with 3-day history of sore throat.  She notes painful and difficulty swallowing, she denies any fever, denies any cough or shortness of breath.  She denies any close sick contacts.  She notes she has 2 children at home.  No covert exposures.  She was seen yesterday and had a negative rapid strep.  She notes that she is able to swallow but has severe pain with swallowing.  She denies any unilateral pain.  She denies any swelling or pain in the neck.  She had covert testing performed yesterday.  She notes her symptoms are not getting better today.   Past Medical History:  Diagnosis Date  . Hypertension     Patient Active Problem List   Diagnosis Date Noted  . Term pregnancy 02/02/2015  . GBS (group B Streptococcus carrier), +RV culture, currently pregnant 01/24/2015  . Obesity affecting pregnancy in third trimester 01/07/2015  . Gestational hypertension w/o significant proteinuria in 3rd trimester 12/23/2014  . Supervision of normal pregnancy 08/09/2014  . Teen pregnancy 08/09/2014    Past Surgical History:  Procedure Laterality Date  . NO PAST SURGERIES    . SUBDERMAL ETONOGESTREL IMPLANT INSERTION  02/05/2015         OB History    Gravida  1   Para  1   Term  1   Preterm  0   AB  0   Living  1     SAB  0   TAB  0   Ectopic  0   Multiple  0   Live Births  1            Home Medications    Prior to Admission medications   Medication Sig Start Date End Date Taking? Authorizing Provider  oxyCODONE-acetaminophen (PERCOCET/ROXICET) 5-325 MG tablet Take 1 tablet by mouth every 6 (six) hours as needed for severe pain. 12/07/18   Eyvonne MechanicHedges, Zayven Powe, PA-C    Family  History Family History  Problem Relation Age of Onset  . Hypertension Mother   . Cancer Maternal Grandmother     Social History Social History   Tobacco Use  . Smoking status: Never Smoker  . Smokeless tobacco: Never Used  Substance Use Topics  . Alcohol use: No  . Drug use: No     Allergies   Patient has no known allergies.   Review of Systems Review of Systems  All other systems reviewed and are negative.  Physical Exam Updated Vital Signs BP 131/82 (BP Location: Right Arm)   Pulse 73   Temp 98.2 F (36.8 C) (Oral)   Resp 17   SpO2 99%   Physical Exam Vitals signs and nursing note reviewed.  Constitutional:      Appearance: She is well-developed.  HENT:     Head: Normocephalic and atraumatic.     Mouth/Throat:     Comments: Bilateral tonsils with erythema and swelling, symmetric, uvula is midline rise of phonation, minor tonsillar exudate noted-no pooling of secretions, voice is normal neck is supple with no lymphadenopathy Eyes:     General: No scleral icterus.       Right eye: No discharge.  Left eye: No discharge.     Conjunctiva/sclera: Conjunctivae normal.     Pupils: Pupils are equal, round, and reactive to light.  Neck:     Musculoskeletal: Normal range of motion.     Vascular: No JVD.     Trachea: No tracheal deviation.  Pulmonary:     Effort: Pulmonary effort is normal. No respiratory distress.     Breath sounds: No stridor. No wheezing or rhonchi.  Neurological:     Mental Status: She is alert and oriented to person, place, and time.     Coordination: Coordination normal.  Psychiatric:        Behavior: Behavior normal.        Thought Content: Thought content normal.        Judgment: Judgment normal.     ED Treatments / Results  Labs (all labs ordered are listed, but only abnormal results are displayed) Labs Reviewed  GROUP A STREP BY PCR    EKG None  Radiology No results found.  Procedures Procedures (including  critical care time)  Medications Ordered in ED Medications  dexamethasone (DECADRON) tablet 10 mg (10 mg Oral Given 12/07/18 1141)  ibuprofen (ADVIL) tablet 600 mg (600 mg Oral Given 12/07/18 1141)     Initial Impression / Assessment and Plan / ED Course  I have reviewed the triage vital signs and the nursing notes.  Pertinent labs & imaging results that were available during my care of the patient were reviewed by me and considered in my medical decision making (see chart for details).         Assessment/Plan: 41 YOF with likely viral pharyngitis.  No signs of PTA, tolerating secretions.  She is afebrile well-appearing.  She has negative strep here.  COVID testing pending.  Patient given Decadron if she is not pregnant or breast-feeding, discharged with Percocet with precautions.  Return precautions given.  She verbalized understanding and agreement to today's plan had no further questions or concerns at time of discharge.    Final Clinical Impressions(s) / ED Diagnoses   Final diagnoses:  Viral pharyngitis    ED Discharge Orders         Ordered    oxyCODONE-acetaminophen (PERCOCET/ROXICET) 5-325 MG tablet  Every 6 hours PRN     12/07/18 1310           Okey Regal, PA-C 12/07/18 1317    Curatolo, Adam, DO 12/07/18 1450

## 2018-12-07 NOTE — ED Notes (Signed)
Patient verbalizes understanding of discharge instructions . Opportunity for questions and answers were provided . Armband removed by staff ,Pt discharged from ED. W/C  offered at D/C  and Declined W/C at D/C and was escorted to lobby by RN.  

## 2018-12-09 LAB — CULTURE, GROUP A STREP (THRC)

## 2019-04-04 ENCOUNTER — Ambulatory Visit
Admission: EM | Admit: 2019-04-04 | Discharge: 2019-04-04 | Disposition: A | Discharge status: Home / Self Care | Payer: Self-pay | Attending: Family Medicine | Admitting: Family Medicine

## 2019-04-04 ENCOUNTER — Other Ambulatory Visit: Payer: Self-pay

## 2019-04-04 DIAGNOSIS — R05 Cough: Secondary | ICD-10-CM

## 2019-04-04 DIAGNOSIS — J069 Acute upper respiratory infection, unspecified: Secondary | ICD-10-CM

## 2019-04-04 DIAGNOSIS — Z7189 Other specified counseling: Secondary | ICD-10-CM

## 2019-04-04 DIAGNOSIS — R0981 Nasal congestion: Secondary | ICD-10-CM

## 2019-04-04 NOTE — ED Triage Notes (Signed)
Pt. States she woke up with a itchy throat, does NOT want to be COVID tested.

## 2019-04-04 NOTE — ED Provider Notes (Signed)
[facility] ____________________________________________  Time seen: Approximately 5:11 PM  I have reviewed the triage vital signs and the nursing notes.   HISTORY  Chief Complaint Sore Throat   HPI Erin Cardenas is a 23 y.o. female presenting for evaluation of runny nose, nasal congestion, cough and some scratchy throat.  Denies sore throat or pain to throat, but states it is somewhat scratchy.  States cough is occasionally productive.  Reports felt fine yesterday.  Reports her dad recently sick with some similar complaints last week.  Denies fevers.  Denies chest pain, shortness of breath, abdominal pain, vomiting, diarrhea, changes in taste or smell.  Has continued to eat and drink well.  Has not had any over-the-counter medications for same complaints.   Past Medical History:  Diagnosis Date  . Hypertension     Patient Active Problem List   Diagnosis Date Noted  . Term pregnancy 02/02/2015  . GBS (group B Streptococcus carrier), +RV culture, currently pregnant 01/24/2015  . Obesity affecting pregnancy in third trimester 01/07/2015  . Gestational hypertension w/o significant proteinuria in 3rd trimester 12/23/2014  . Supervision of normal pregnancy 08/09/2014  . Teen pregnancy 08/09/2014    Past Surgical History:  Procedure Laterality Date  . NO PAST SURGERIES    . SUBDERMAL ETONOGESTREL IMPLANT INSERTION  02/05/2015         No current facility-administered medications for this encounter.   Current Outpatient Medications:  .  oxyCODONE-acetaminophen (PERCOCET/ROXICET) 5-325 MG tablet, Take 1 tablet by mouth every 6 (six) hours as needed for severe pain., Disp: 6 tablet, Rfl: 0  Allergies Patient has no known allergies.  Family History  Problem Relation Age of Onset  . Healthy Mother   . Cancer Maternal Grandmother   . Healthy Father     Social History Social History   Tobacco Use  . Smoking status: Never Smoker  . Smokeless tobacco: Never  Used  Substance Use Topics  . Alcohol use: No  . Drug use: No    Review of Systems Constitutional: No fever ENT: As above.  Cardiovascular: Denies chest pain. Respiratory: Denies shortness of breath. Gastrointestinal: No abdominal pain.   Musculoskeletal: Negative for back pain. Skin: Negative for rash.   ____________________________________________   PHYSICAL EXAM:  VITAL SIGNS: ED Triage Vitals  Enc Vitals Group     BP 04/04/19 1643 136/86     Pulse Rate 04/04/19 1643 88     Resp 04/04/19 1643 19     Temp 04/04/19 1643 99.3 F (37.4 C)     Temp Source 04/04/19 1643 Oral     SpO2 04/04/19 1643 100 %     Weight 04/04/19 1641 236 lb (107 kg)     Height --      Head Circumference --      Peak Flow --      Pain Score --      Pain Loc --      Pain Edu? --      Excl. in GC? --     Constitutional: Alert and oriented. Well appearing and in no acute distress. Eyes: Conjunctivae are normal.  Head: Atraumatic. No sinus tenderness to palpation. No swelling. No erythema.  Ears: no erythema, normal TMs bilaterally.   Nose:Nasal congestion   Mouth/Throat: Mucous membranes are moist. No pharyngeal erythema. No tonsillar swelling or exudate.  Neck: No stridor.  No cervical spine tenderness to palpation. Hematological/Lymphatic/Immunilogical: No cervical lymphadenopathy. Cardiovascular: Normal rate, regular rhythm. Grossly normal heart sounds.  Good  peripheral circulation. Respiratory: Normal respiratory effort.  No retractions. No wheezes, rales or rhonchi. Good air movement.  Musculoskeletal: Ambulatory with steady gait.  Neurologic:  Normal speech and language. No gait instability. Skin:  Skin appears warm, dry and intact. No rash noted. Psychiatric: Mood and affect are normal. Speech and behavior are normal.  ___________________________________________   LABS (all labs ordered are listed, but only abnormal results are displayed)  Labs Reviewed  NOVEL CORONAVIRUS, NAA  (HOSP ORDER, SEND-OUT TO REF LAB; TAT 18-24 HRS)     PROCEDURES Procedures    INITIAL IMPRESSION / ASSESSMENT AND PLAN / ED COURSE  Pertinent labs & imaging results that were available during my care of the patient were reviewed by me and considered in my medical decision making (see chart for details).  Well-appearing patient.  No acute distress.  Suspect viral illness.  Offered strep swab, patient declined.  COVID-19 testing completed and advice given.  Encourage rest, fluids, supportive care, over-the-counter cough congestion medication as needed.  Discussed follow up with Primary care physician this week as needed. Discussed follow up and return parameters including no resolution or any worsening concerns. Patient verbalized understanding and agreed to plan.   ____________________________________________   FINAL CLINICAL IMPRESSION(S) / ED DIAGNOSES  Final diagnoses:  Upper respiratory tract infection, unspecified type  Advice given about COVID-19 virus infection     ED Discharge Orders    None       Note: This dictation was prepared with Dragon dictation along with smaller phrase technology. Any transcriptional errors that result from this process are unintentional.         Marylene Land, NP 04/04/19 1732

## 2019-04-04 NOTE — Discharge Instructions (Addendum)
Over-the-counter cough congestion medication.  Rest. Drink plenty of fluids.   Follow up with your primary care physician this week as needed. Return to Urgent care for new or worsening concerns.   

## 2019-04-08 LAB — NOVEL CORONAVIRUS, NAA (HOSP ORDER, SEND-OUT TO REF LAB; TAT 18-24 HRS): SARS-CoV-2, NAA: NOT DETECTED

## 2020-06-12 ENCOUNTER — Other Ambulatory Visit: Payer: Self-pay

## 2020-06-12 ENCOUNTER — Other Ambulatory Visit: Payer: Medicaid Other

## 2020-06-12 DIAGNOSIS — Z20822 Contact with and (suspected) exposure to covid-19: Secondary | ICD-10-CM

## 2020-06-13 LAB — SARS-COV-2, NAA 2 DAY TAT

## 2020-06-13 LAB — NOVEL CORONAVIRUS, NAA: SARS-CoV-2, NAA: NOT DETECTED
